# Patient Record
Sex: Male | Born: 1960 | Race: White | Hispanic: No | Marital: Single
Health system: Midwestern US, Academic
[De-identification: ages and names within clinical notes are randomized; demographics above are authoritative.]

---

## 2018-01-06 ENCOUNTER — Encounter: Admit: 2018-01-06 | Discharge: 2018-01-06

## 2018-01-06 MED ORDER — DULOXETINE 60 MG PO CPDR
60 mg | ORAL_CAPSULE | Freq: Two times a day (BID) | ORAL | 0 refills | 60.00000 days | Status: AC
Start: 2018-01-06 — End: 2019-11-17

## 2019-08-11 ENCOUNTER — Ambulatory Visit: Admit: 2019-08-11 | Discharge: 2019-08-11 | Payer: BC Managed Care – PPO

## 2019-08-11 ENCOUNTER — Encounter: Admit: 2019-08-11 | Discharge: 2019-08-11 | Payer: BC Managed Care – PPO

## 2019-08-11 DIAGNOSIS — Z1211 Encounter for screening for malignant neoplasm of colon: Secondary | ICD-10-CM

## 2019-08-11 DIAGNOSIS — K219 Gastro-esophageal reflux disease without esophagitis: Secondary | ICD-10-CM

## 2019-08-11 DIAGNOSIS — Z125 Encounter for screening for malignant neoplasm of prostate: Secondary | ICD-10-CM

## 2019-08-11 DIAGNOSIS — E139 Other specified diabetes mellitus without complications: Secondary | ICD-10-CM

## 2019-08-11 LAB — MICROALB/CR RATIO-URINE RANDOM: Lab: 38 mg/dL — ABNORMAL HIGH (ref 0.3–1.2)

## 2019-08-11 LAB — PSA SCREEN: Lab: 0.5 ng/mL (ref <4.01)

## 2019-08-11 LAB — CBC
Lab: 31 pg (ref 26–34)
Lab: 4.4 M/UL (ref 4.4–5.5)
Lab: 4.9 10*3/uL (ref 4.5–11.0)
Lab: 41 % (ref 40–50)

## 2019-08-11 LAB — COMPREHENSIVE METABOLIC PANEL
Lab: 138 MMOL/L (ref 137–147)
Lab: 4.5 MMOL/L (ref 3.5–5.1)

## 2019-08-11 LAB — LIPID PROFILE
Lab: 102 mg/dL (ref <200)
Lab: 19 mg/dL (ref 8.5–10.6)
Lab: 45 mg/dL (ref <100)
Lab: 60 mg/dL (ref 6.0–8.0)
Lab: 96 mg/dL (ref <150)

## 2019-08-11 LAB — TSH WITH FREE T4 REFLEX: Lab: 1.6 uU/mL (ref 0.35–5.00)

## 2019-08-11 LAB — HEMOGLOBIN A1C: Lab: 5.6 % (ref >40)

## 2019-08-12 ENCOUNTER — Encounter: Admit: 2019-08-12 | Discharge: 2019-08-12 | Payer: BC Managed Care – PPO

## 2019-08-12 DIAGNOSIS — E538 Deficiency of other specified B group vitamins: Secondary | ICD-10-CM

## 2019-08-13 ENCOUNTER — Ambulatory Visit: Admit: 2019-08-13 | Discharge: 2019-08-13 | Payer: BC Managed Care – PPO

## 2019-08-13 ENCOUNTER — Encounter: Admit: 2019-08-13 | Discharge: 2019-08-13 | Payer: BC Managed Care – PPO

## 2019-08-13 DIAGNOSIS — E538 Deficiency of other specified B group vitamins: Secondary | ICD-10-CM

## 2019-08-13 DIAGNOSIS — Z1211 Encounter for screening for malignant neoplasm of colon: Secondary | ICD-10-CM

## 2019-08-13 LAB — VITAMIN B12: Lab: 148 pg/mL — ABNORMAL LOW (ref 180–914)

## 2019-08-14 ENCOUNTER — Encounter: Admit: 2019-08-14 | Discharge: 2019-08-14 | Payer: BC Managed Care – PPO

## 2019-08-14 DIAGNOSIS — Z0000 Encounter for general adult medical examination without abnormal findings: Secondary | ICD-10-CM

## 2019-08-14 MED ORDER — CYANOCOBALAMIN (VITAMIN B-12) 1,000 MCG/ML IJ SOLN
1 mL | INTRAMUSCULAR | 3 refills | 29.00000 days | Status: AC
Start: 2019-08-14 — End: ?

## 2019-08-26 ENCOUNTER — Encounter: Admit: 2019-08-26 | Discharge: 2019-08-26 | Payer: BC Managed Care – PPO

## 2019-08-28 ENCOUNTER — Encounter: Admit: 2019-08-28 | Discharge: 2019-08-28 | Payer: BC Managed Care – PPO

## 2019-08-28 DIAGNOSIS — M1990 Unspecified osteoarthritis, unspecified site: Secondary | ICD-10-CM

## 2019-08-28 DIAGNOSIS — Z20822 Encounter for screening laboratory testing for COVID-19 virus in asymptomatic patient: Secondary | ICD-10-CM

## 2019-08-28 DIAGNOSIS — E119 Type 2 diabetes mellitus without complications: Secondary | ICD-10-CM

## 2019-08-28 DIAGNOSIS — I10 Essential (primary) hypertension: Secondary | ICD-10-CM

## 2019-08-28 MED ORDER — SUPREP BOWEL PREP KIT 17.5-3.13-1.6 GRAM PO SOLR
354 mL | Freq: Once | ORAL | 0 refills | Status: AC
Start: 2019-08-28 — End: ?

## 2019-09-05 ENCOUNTER — Encounter: Admit: 2019-09-05 | Discharge: 2019-09-06 | Payer: BC Managed Care – PPO

## 2019-09-08 ENCOUNTER — Encounter: Admit: 2019-09-08 | Discharge: 2019-09-08 | Payer: BC Managed Care – PPO

## 2019-09-08 DIAGNOSIS — M1990 Unspecified osteoarthritis, unspecified site: Secondary | ICD-10-CM

## 2019-09-08 DIAGNOSIS — I10 Essential (primary) hypertension: Secondary | ICD-10-CM

## 2019-09-08 DIAGNOSIS — E119 Type 2 diabetes mellitus without complications: Secondary | ICD-10-CM

## 2019-09-08 MED ORDER — LACTATED RINGERS IV SOLP
INTRAVENOUS | 0 refills | Status: DC
Start: 2019-09-08 — End: 2019-09-13

## 2019-09-08 MED ORDER — PROPOFOL INJ 10 MG/ML IV VIAL
0 refills | Status: DC
Start: 2019-09-08 — End: 2019-09-08

## 2019-09-08 MED ORDER — ONDANSETRON HCL (PF) 4 MG/2 ML IJ SOLN
4 mg | Freq: Once | INTRAVENOUS | 0 refills | Status: AC | PRN
Start: 2019-09-08 — End: ?

## 2019-09-08 MED ORDER — GLUCAGON HCL 1 MG/ML IJ SOLR
0 refills | Status: DC
Start: 2019-09-08 — End: 2019-09-08

## 2019-09-08 MED ORDER — LIDOCAINE (PF) 10 MG/ML (1 %) IJ SOLN
.1-2 mL | INTRAMUSCULAR | 0 refills | Status: DC | PRN
Start: 2019-09-08 — End: 2019-09-13

## 2019-09-08 MED ORDER — LACTATED RINGERS IV SOLP
0 refills | Status: DC
Start: 2019-09-08 — End: 2019-09-08

## 2019-09-08 MED ORDER — PROPOFOL 10 MG/ML IV EMUL 50 ML (INFUSION)(AM)(OR)
INTRAVENOUS | 0 refills | Status: DC
Start: 2019-09-08 — End: 2019-09-08

## 2019-09-08 MED ORDER — SIMETHICONE 40 MG/0.6 ML PO DRPS
0 refills | Status: DC
Start: 2019-09-08 — End: 2019-09-13

## 2019-09-09 ENCOUNTER — Encounter: Admit: 2019-09-09 | Discharge: 2019-09-09 | Payer: BC Managed Care – PPO

## 2019-09-09 DIAGNOSIS — E119 Type 2 diabetes mellitus without complications: Secondary | ICD-10-CM

## 2019-09-09 DIAGNOSIS — I10 Essential (primary) hypertension: Secondary | ICD-10-CM

## 2019-09-09 DIAGNOSIS — M1990 Unspecified osteoarthritis, unspecified site: Secondary | ICD-10-CM

## 2019-09-10 ENCOUNTER — Encounter: Admit: 2019-09-10 | Discharge: 2019-09-10 | Payer: BC Managed Care – PPO

## 2019-09-10 NOTE — Telephone Encounter
His liver enzymes are normal.  I don't think an abdominal ultrasound is necessary.  Since he has had gastric bypass and significant weight loss, this is the best thing for liver function, as well as no longer drinking ETOH.  He doesn't show any physical symptoms or lab abnormalities that are concerning for liver function and has already made changes that would be beneficial for overall liver health.

## 2019-09-10 NOTE — Telephone Encounter
Patient requesting Dr Stones advice re test results.   Philip Gilbert noted that his bilirubin is on the high end of normal.   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   He is wondering if an [name] of his liver would be advisable in light of his hx and the bili result.   Or any other advice?    Natasha Bence, RN

## 2019-09-14 ENCOUNTER — Encounter: Admit: 2019-09-14 | Discharge: 2019-09-14 | Payer: BC Managed Care – PPO

## 2019-09-15 MED ORDER — METFORMIN 500 MG PO TAB
ORAL_TABLET | Freq: Two times a day (BID) | ORAL | 5 refills | 90.00000 days | Status: AC
Start: 2019-09-15 — End: ?

## 2019-09-17 ENCOUNTER — Encounter: Admit: 2019-09-17 | Discharge: 2019-09-17 | Payer: BC Managed Care – PPO

## 2019-09-17 MED ORDER — PROMETHAZINE-CODEINE 6.25-10 MG/5 ML PO SYRP
5 mL | ORAL | 0 refills | 7.00000 days | Status: AC | PRN
Start: 2019-09-17 — End: ?

## 2019-09-17 NOTE — Telephone Encounter
Informed pt cough medicine script sent to his [facility] pharmacy

## 2019-09-17 NOTE — Telephone Encounter
Pt called re has had hacky cough for 2 days, unable to sleep.  States has had this before and cough med with codeine was the only thing that helped.  Has nasal congestion as well and has been using nasal spray

## 2019-10-14 MED ORDER — ATORVASTATIN 20 MG PO TAB
20 mg | ORAL_TABLET | Freq: Every day | ORAL | 3 refills | Status: AC
Start: 2019-10-14 — End: ?

## 2019-10-20 ENCOUNTER — Encounter: Admit: 2019-10-20 | Discharge: 2019-10-20 | Payer: BC Managed Care – PPO

## 2019-10-21 ENCOUNTER — Encounter: Admit: 2019-10-21 | Discharge: 2019-10-21 | Payer: BC Managed Care – PPO

## 2019-10-21 MED ORDER — BENZONATATE 100 MG PO CAP
100 mg | ORAL_CAPSULE | ORAL | 1 refills | 9.00000 days | Status: DC | PRN
Start: 2019-10-21 — End: 2019-12-22

## 2019-11-05 ENCOUNTER — Encounter: Admit: 2019-11-05 | Discharge: 2019-11-05 | Payer: BC Managed Care – PPO

## 2019-11-05 ENCOUNTER — Ambulatory Visit: Admit: 2019-11-05 | Discharge: 2019-11-05 | Payer: BC Managed Care – PPO

## 2019-11-05 DIAGNOSIS — M064 Inflammatory polyarthropathy: Secondary | ICD-10-CM

## 2019-11-05 DIAGNOSIS — E119 Type 2 diabetes mellitus without complications: Secondary | ICD-10-CM

## 2019-11-05 DIAGNOSIS — Z9889 Other specified postprocedural states: Secondary | ICD-10-CM

## 2019-11-05 DIAGNOSIS — K5904 Chronic idiopathic constipation: Secondary | ICD-10-CM

## 2019-11-05 DIAGNOSIS — M1990 Unspecified osteoarthritis, unspecified site: Secondary | ICD-10-CM

## 2019-11-05 DIAGNOSIS — I10 Essential (primary) hypertension: Secondary | ICD-10-CM

## 2019-11-05 DIAGNOSIS — R634 Abnormal weight loss: Secondary | ICD-10-CM

## 2019-11-05 DIAGNOSIS — Z79899 Other long term (current) drug therapy: Secondary | ICD-10-CM

## 2019-11-05 DIAGNOSIS — M069 Rheumatoid arthritis, unspecified: Secondary | ICD-10-CM

## 2019-11-05 DIAGNOSIS — R630 Anorexia: Secondary | ICD-10-CM

## 2019-11-05 DIAGNOSIS — D849 Immunodeficiency, unspecified: Secondary | ICD-10-CM

## 2019-11-05 DIAGNOSIS — Z0000 Encounter for general adult medical examination without abnormal findings: Secondary | ICD-10-CM

## 2019-11-05 DIAGNOSIS — E538 Deficiency of other specified B group vitamins: Secondary | ICD-10-CM

## 2019-11-05 DIAGNOSIS — Z9884 Bariatric surgery status: Secondary | ICD-10-CM

## 2019-11-05 LAB — CBC AND DIFF
Lab: 0 K/UL (ref 0–0.20)
Lab: 0.1 10*3/uL (ref 0–0.45)
Lab: 0.4 10*3/uL (ref 0–0.80)
Lab: 1 % (ref >60)
Lab: 1.7 10*3/uL (ref 1.0–4.8)
Lab: 10 % (ref 4–12)
Lab: 13 g/dL (ref 13.5–16.5)
Lab: 14 % (ref 11–15)
Lab: 2.3 K/UL (ref 1.8–7.0)
Lab: 269 10*3/uL (ref 150–400)
Lab: 31 pg (ref 26–34)
Lab: 32 g/dL (ref 32.0–36.0)
Lab: 36 % (ref 24–44)
Lab: 4 % (ref >60)
Lab: 4.3 M/UL — ABNORMAL LOW (ref 4.4–5.5)
Lab: 4.8 10*3/uL (ref 4.5–11.0)
Lab: 42 % (ref 40–50)
Lab: 49 % (ref 41–77)
Lab: 7.6 FL (ref 7–11)
Lab: 96 FL (ref 80–100)

## 2019-11-05 LAB — COMPREHENSIVE METABOLIC PANEL
Lab: 140 MMOL/L (ref 137–147)
Lab: 4.5 MMOL/L (ref 3.5–5.1)

## 2019-11-05 LAB — C REACTIVE PROTEIN (CRP): Lab: 0 mg/dL (ref <1.0)

## 2019-11-05 LAB — IRON + BINDING CAPACITY + %SAT+ FERRITIN
Lab: 353 ug/dL (ref 270–380)
Lab: 70 ug/dL (ref 50–185)
Lab: 74 ng/mL (ref 30–300)

## 2019-11-05 LAB — VITAMIN B12: Lab: 260 pg/mL — ABNORMAL LOW (ref 180–914)

## 2019-11-05 LAB — FOLATE, SERUM: Lab: >22 ng/mL (ref >3.9)

## 2019-11-05 MED ORDER — LINZESS 72 MCG PO CAP
72 ug | ORAL_CAPSULE | Freq: Every day | ORAL | 1 refills | 30.00000 days | Status: AC
Start: 2019-11-05 — End: ?

## 2019-11-05 NOTE — Progress Notes
Obtained patient's verbal consent to treat them and their agreement to [facility] financial policy and NPP via this telehealth visit during the [facility]    IMPRESSION:  59 year old male past medical history significant for chronic constipation, RA, multiple abdominal surgeries here for follow-up after colonoscopy.    Discussion: here is a 59 year old male with chronic constipation and multiple GI surgeries including gastric bypass here for follow-up after his colonoscopy.  He is still experiencing chronic constipation and reports having better control on Linzess.  He in the past was trialed with high-dose Linzess at 145 and reports effectiveness but this caused diarrhea.  Therefore, we will start him on low-dose of 72 mcg and he was advised to contact our clinic if diarrhea were to occur on this dose and to hold subsequent dosing.  We also discussed his low vitamin B12 levels.  Likely this is due to his previous gastric surgery.  He remains on IM injections.  We will go ahead and reevaluate levels and obtain other routine labs.  He was advised to avoid Imodium instead, he was advised to form a good bowel regimen to prevent overflow diarrhea.  We also discussed with the amounts of weight loss that he has been on with Ozempic that we have do not recommend any further weight loss.  We would like for him to discuss Ozempic long-term plans in light of this extreme weight loss with his primary care physician.  He may return to clinic in 6 months with Adam Case, APRN    1.  Chronic constipation  2.  Immunocompromised  3.  Rheumatoid arthritis  4.  Status post gastric bypass surgery and hernia repair x3  5.  Weight loss  6.  Low appetite  7.  Health maintenance  8.  Low vitamin B12 on IM injections  9.  Medication management        RECOMMENDATIONS:  The following labs will be repeated/ordered.  CBC, CMP, CRP, celiac panel, vitamin B12, folate, iron panel and vitamin D  Avoid Imodium  We can start Linzess 72 mcg daily    He will follow-up with his primary care regarding continued weight loss and to verify long-term plans with Ozempic in light of his extreme weight loss.    RTC in 6 months with Adam Case, APRN        Reason for Visit:  Follow-up, constipation    HPI:  Here is a 59 year old male with past medical history significant for chronic constipation, RA and multiple abdominal surgeries here for follow-up after colonoscopy.  Is a very pleasant gentleman who provides most of his medical records that we do have some history with an O2.  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  He reports inguinal hernia repair x3 and has had history of gastric bypass surgery.  He was initially referred as he had had a 70 pound weight loss likely due to Ozempic.  He underwent colonoscopy in March 2021 with our clinic and was found to have normal findings with the exception of redundant colon.  Of note, he does have history of rheumatoid arthritis and is on 20 mg methotrexate weekly with folic acid.  He reports he has had trouble with bowel prep in the and has questions regarding chronic constipation.  He mentions he has been trialed on Linzess in the past but can cause diarrhea.  He has tried MiraLAX in the past but reports better control with Linzess.    Today he mentions he is still  having periods of constipation and overflow diarrhea.  Typically, his constipation may last up to 3 days then he will have mushy urgent stools that may occur up to 2-3 times a day.  When this occurs, he also experiences tenesmus and lower abdominal cramping that precedes the episode.  Otherwise, he denies red flag symptoms of mucus, rectal pain, bloody stool or incontinence.  He denies symptoms concerning for obstruction such as nausea, vomiting, abdominal bloating or pain.  He does report a lowered appetite but again attributes this to Ozempic.  He does report joint pain related to rheumatoid arthritis for which is well controlled with 20 mg methotrexate.  He otherwise denies extraintestinal manifestations of his rash, eye pain/redness or oral ulcers.  Family history is negative for colorectal cancer, colorectal surgery or ulcerative colitis  He reports very rare use of NSAIDs but denies cigarette smoking.    History of the disease:  As above    PMH:  Medical History:   Diagnosis Date   ? Arthritis    ? DM (diabetes mellitus) (HCC)    ? Hypertension        PSH:  Surgical History:   Procedure Laterality Date   ? STOMACH SURGERY  1960    pyloric stenosis   ? HERNIA REPAIR  1961    1982, 1995   ? GASTRIC BYPASS  2003   ? LAP BAND  2012   ? CERVICAL SPINE SURGERY  2016   ? COLONOSCOPY DIAGNOSTIC WITH SPECIMEN COLLECTION BY BRUSHING/ WASHING - FLEXIBLE N/A 09/08/2019    Performed by Remigio Eisenmenger, MD at [facility] OR       Current Medications:    Current Outpatient Medications:   ?  ALPRAZolam (XANAX) 0.5 mg tablet, Take 1 tablet by mouth twice daily as needed., Disp: , Rfl:   ?  amLODIPine (NORVASC) 5 mg tablet, Take 5 mg by mouth daily., Disp: , Rfl:   ?  aspirin EC 81 mg tablet, Take 81 mg by mouth., Disp: , Rfl:   ?  atorvastatin (LIPITOR) 20 mg tablet, Take one tablet by mouth daily., Disp: 90 tablet, Rfl: 3  ?  benzonatate (TESSALON PERLES) 100 mg capsule, Take one capsule by mouth every 8 hours as needed for Cough., Disp: 30 capsule, Rfl: 1  ?  cyanocobalamin (RUBRAMIN) 1,000 mcg/mL injection, Inject 1 mL into the muscle every 30 days., Disp: 3 mL, Rfl: 3  ?  diclofenac sodium DR (VOLTAREN) 75 mg tablet, Take 75 mg by mouth., Disp: , Rfl:   ?  doxycycline (MONODOX) 100 mg capsule, Take 100 mg by mouth., Disp: , Rfl:   ?  duloxetine DR (CYMBALTA) 60 mg capsule, Take one capsule by mouth twice daily., Disp: 60 capsule, Rfl: 0  ?  ezetimibe (ZETIA) 10 mg tablet, Take 10 mg by mouth., Disp: , Rfl:   ?  folic acid (FOLVITE) 1 mg tablet, Take 1 mg by mouth daily., Disp: , Rfl:   ?  gabapentin (NEURONTIN) 600 mg tablet, Take 600 mg by mouth twice daily., Disp: , Rfl:   ?  hydrOXYchloroQUINE (PLAQUENIL) 200 mg tablet, Take 400 mg by mouth daily., Disp: , Rfl:   ?  metFORMIN (GLUCOPHAGE) 500 mg tablet, TAKE 2 TABLETS BY MOUTH TWICE DAILY, Disp: 120 tablet, Rfl: 5  ?  methotrexate sodium (RHEUMATREX) 2.5 mg tablet, TAKE 8 TABLETS BY MOUTH EVERY WEEK, Disp: , Rfl:   ?  Multivitamin,Tx-Minerals (MULTI-VITAMIN HP/MINERALS) cap, , Disp: , Rfl:   ?  omeprazole DR (PRILOSEC) 40 mg capsule, Take 40 mg by mouth daily., Disp: , Rfl:   ?  OZEMPIC 0.25 mg or 0.5 mg(2 mg/1.5 mL) injection PEN, , Disp: , Rfl:   ?  promethazine-codeine (PHENERGAN W/CODEINE) 6.25-10 mg/5 mL oral syrup, Take 5 mL by mouth every 6 hours as needed for Cough., Disp: 120 mL, Rfl: 0  ?  silodosin (RAPAFLO) 8 mg capsule, Take 8 mg by mouth daily., Disp: , Rfl:   ?  tamsulosin (FLOMAX) 0.4 mg capsule, Take 0.4 mg by mouth., Disp: , Rfl:   ?  traMADoL (ULTRAM) 50 mg tablet, Take 1 tablet by mouth at bedtime daily., Disp: , Rfl:     Allergies:  Allergies   Allergen Reactions   ? Ropinirole FLUSHING (SKIN), SEE COMMENTS, PHOTOSENSITIVITY and SWEATING       SHx:  Social History     Tobacco Use   ? Smoking status: Never Smoker   ? Smokeless tobacco: Never Used   Substance Use Topics   ? Alcohol use: Not Currently     Comment: quit in 2016   ? Drug use: Never       FHx:  Family History   Problem Relation Age of Onset   ? Cancer Mother         leukemia   ? COPD Father    ? Diabetes Father    ? Hypertension Father    ? Heart Disease Father    ? Diabetes Brother        Depression Screening:  Patient Scores:  PHQ-2: No data recorded  PHQ-9: No data recorded  Interventions:  PHQ-2: No data recorded  Depression Interventions PHQ-2/9: No data recorded    ROS:  Review of Systems    PE:  There were no vitals filed for this visit.        There is no height or weight on file to calculate BMI.    Physical Exam   Constitutional: Pt. is oriented to person, place, and time and in no distress.   HENT: Head: Normocephalic and atraumatic.   Eyes: Conjunctivae and EOM are normal. Pupils are equal, round, and reactive to light.   Neck: Normal range of motion. Neck supple.   Cardiovascular: Normal rate, regular rhythm and normal heart sounds.    Pulmonary/Chest: Effort normal and breath sounds normal. No respiratory distress. Pt. has no wheezes. Pt. has no rales.   Abdominal: Soft. Bowel sounds are normal. Pt. exhibits no distension and no mass. There is no tenderness. There is no rebound and no guarding.   Musculoskeletal: Normal range of motion. There is no edema, tenderness or deformity.   Neurological: Pt. is alert and oriented to person, place, and time. Gait normal.   Skin: Skin is warm and dry. No rash noted. No erythema.   Psychiatric: Affect and judgment normal.    08/2019  Colonoscopy    Findings:   ? ? ?The perianal and digital rectal examinations were normal.   ? ? ?The colon (entire examined portion) was moderately redundant.   ? ? ?Normal mucosa was found in the entire colon.   ? ? ?The retroflexed view of the distal rectum and anal verge was normal and   ? ? ?showed some prominant rectal veins but no anal abnormalities.   Impression: ? ? ? ? ? ? ? ? ? - Redundant colon.   ? ? ? ? ? ? ? ? ? ? ? ? ? ? ? - Normal mucosa  in the entire examined colon.   ? ? ? ? ? ? ? ? ? ? ? ? ? ? ? - No specimens collected.       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 High fiber diet , diet and exercise   Continue b12 replacement   Follow up PCP for his weight loss , reports 70 pound weight loss after starting ozempic   I personally performed the key portions of the E/M visit, discussed case with resident and concur with resident documentation of history, physical exam, assessment, and treatment plan with the following additions/modifications noted above.    Staff name:  Remigio Eisenmenger, MD Date:  11/05/2019

## 2019-11-05 NOTE — Patient Instructions
1. Stop Imodium  2. Linzess - daily. This was sent to your pharmacy on file.  3. Labs. Orders are in. You can go to any [place] lab location to complete.  4. Return to Gi clinic in 6 months to see Dr. Maryruth Bun.    As part of the CARES act, starting April 1st some results are released to you automatically. Your provider will continue to send you a detailed result note on any labs that they order, but with these changes you may see your results before they do. Critical lab results will be addressed immediately, but otherwise please give your provider 72 hours (3 business days) to view and respond to your results before reaching out with any questions. Depending on your questions, they may ask you to schedule a telehealth or telephone visit to discuss further. This visit may be billed to your insurance depending on time and complexity.      If you have any questions or concerns please contact Dr. Shelda Altes nurse, Dahlia Client, at  [phone]. If you have internet access/smart phone please contact us through   MyChart. This is the most efficient way to contact your healthcare team.     If you have any questions or concerns regarding your medications, please contact the IBD Pharmacists at [phone] or e-mail [email or web address].        [facility] Locations:     [address], [address] and [employer]  [address] 1st floor, directly to the left of the Information Desk  [place], [place] [address]  At [facility] and [address] adjacent to [facility] [place] [facility]  [phone]  ? 7 a.m.-6 p.m. Monday-Friday  ? 7 a.m.-noon Saturday  [facility] [place]  [facility]  [address] [place], [place] [address]  [phone]  ? 8 a.m.?4:30 p.m. Monday-Friday  [facility]  [address]  [place], [place] [address]  [phone]  ? 8 a.m.-5 p.m. Monday-Friday  [employer]  [facility] [place] [facility]  [address]  [place], [place] [address]  [phone]  ? 7 a.m.-5:30 p.m. Monday-Friday  [facility]  [address]  [place], [place] [address]  [phone]  ? 8 a.m.-4:30 p.m. Monday-Friday  Locations are closed all major holidays (excluding Veterans Day).

## 2019-11-09 ENCOUNTER — Encounter: Admit: 2019-11-09 | Discharge: 2019-11-09 | Payer: BC Managed Care – PPO

## 2019-11-09 MED ORDER — CHOLECALCIFEROL (VITAMIN D3) 1,250 MCG (50,000 UNIT) PO CAP
50000 [IU] | ORAL_CAPSULE | ORAL | 0 refills | 84.00000 days | Status: AC
Start: 2019-11-09 — End: ?

## 2019-11-09 NOTE — Telephone Encounter
-----   Message from Remigio Eisenmenger, MD sent at 11/09/2019  2:11 PM CDT -----  Dahlia Client .... please provide the vit D prescription.  Thx

## 2019-11-09 NOTE — Telephone Encounter
Vit b12 levels are still on the lower side , please continue your supplementation   Vit d levels are low , I recommend you take vit d 50,000IU once per week for 10 weeks followed by 2000IU once daily   Thx   Dr Maryruth Bun   Written by Remigio Eisenmenger, MD on 11/09/2019 ?2:11 PM CDT  Seen by patient Rolland Porter on 11/09/2019 ?2:11 PM CDT  --------------------------------------------------------------------------------------------------------------------------------------------------------------------    Vitamin D rx sent to pharmacy on file. Pt notified and provided instructions via MyChart.

## 2019-11-10 MED ORDER — OZEMPIC 0.25 MG OR 0.5 MG(2 MG/1.5 ML) SC PNIJ
.5 mg | SUBCUTANEOUS | 3 refills | Status: AC
Start: 2019-11-10 — End: ?

## 2019-11-15 ENCOUNTER — Encounter: Admit: 2019-11-15 | Discharge: 2019-11-15 | Payer: BC Managed Care – PPO

## 2019-11-17 MED ORDER — DULOXETINE 60 MG PO CPDR
60 mg | ORAL_CAPSULE | Freq: Two times a day (BID) | ORAL | 3 refills | 60.00000 days | Status: DC
Start: 2019-11-17 — End: 2019-12-24

## 2019-11-19 ENCOUNTER — Encounter: Admit: 2019-11-19 | Discharge: 2019-11-19 | Payer: BC Managed Care – PPO

## 2019-11-19 NOTE — Telephone Encounter
PA was received through [employer] and completed through [email or web address]  Key Code: [id number]  Medication: Linzess # 30 per 30 days  PA can take 24-72 hours for determination.

## 2019-11-24 ENCOUNTER — Encounter: Admit: 2019-11-24 | Discharge: 2019-11-24 | Payer: BC Managed Care – PPO

## 2019-11-26 ENCOUNTER — Encounter: Admit: 2019-11-26 | Discharge: 2019-11-26 | Payer: BC Managed Care – PPO

## 2019-12-03 ENCOUNTER — Encounter: Admit: 2019-12-03 | Discharge: 2019-12-03 | Payer: BC Managed Care – PPO

## 2019-12-03 NOTE — Telephone Encounter
Called patient to schedule appointment. Patient declines services at this time. Provided information about program and how to call and schedule in the future should patient change their mind.    Haynes Bast, MS RD LD  Care Management Dietitian   Voicemail Line [phone]

## 2019-12-11 ENCOUNTER — Encounter: Admit: 2019-12-11 | Discharge: 2019-12-11 | Payer: BC Managed Care – PPO

## 2019-12-11 MED ORDER — GABAPENTIN 600 MG PO TAB
ORAL_TABLET | Freq: Two times a day (BID) | 1 refills | Status: AC
Start: 2019-12-11 — End: ?

## 2019-12-11 NOTE — Telephone Encounter
LOV 08/11/19 Est Care

## 2019-12-12 ENCOUNTER — Encounter: Admit: 2019-12-12 | Discharge: 2019-12-12 | Payer: BC Managed Care – PPO

## 2019-12-14 ENCOUNTER — Encounter: Admit: 2019-12-14 | Discharge: 2019-12-14 | Payer: BC Managed Care – PPO

## 2019-12-15 ENCOUNTER — Encounter: Admit: 2019-12-15 | Discharge: 2019-12-15 | Payer: BC Managed Care – PPO

## 2019-12-16 ENCOUNTER — Ambulatory Visit: Admit: 2019-12-16 | Discharge: 2019-12-17 | Payer: BC Managed Care – PPO

## 2019-12-16 ENCOUNTER — Encounter: Admit: 2019-12-16 | Discharge: 2019-12-16 | Payer: BC Managed Care – PPO

## 2019-12-16 DIAGNOSIS — I10 Essential (primary) hypertension: Secondary | ICD-10-CM

## 2019-12-16 DIAGNOSIS — E785 Hyperlipidemia, unspecified: Secondary | ICD-10-CM

## 2019-12-16 DIAGNOSIS — M720 Palmar fascial fibromatosis [Dupuytren]: Secondary | ICD-10-CM

## 2019-12-16 DIAGNOSIS — M1990 Unspecified osteoarthritis, unspecified site: Secondary | ICD-10-CM

## 2019-12-16 DIAGNOSIS — Z9884 Bariatric surgery status: Secondary | ICD-10-CM

## 2019-12-16 DIAGNOSIS — E119 Type 2 diabetes mellitus without complications: Secondary | ICD-10-CM

## 2019-12-16 DIAGNOSIS — R630 Anorexia: Secondary | ICD-10-CM

## 2019-12-16 DIAGNOSIS — R5383 Other fatigue: Secondary | ICD-10-CM

## 2019-12-16 NOTE — Patient Instructions
It was nice to see you today!    Goals we discussed today:     Please lower your Ozempic to 0.25 mg-- this will help improve your appetite    Please come to the lab tomorrow morning at 8 AM fasting.     I would like to get you in with Dr. Margy Clarks, this might take a while. I will be reviewing your case with her once I have labs back.        Please contact [facility] for any questions or abnormal glucose values (above 300 or less than 70 repeatedly).  [phone]   My nurse, Melvenia Beam, can be reached at [phone]        Health goals for a person with diabetes to avoid complications are:    Check your FingerStick blood glucose:  Each time you take medications for your glucose, goals are:   Fasting and pre-meal glucose:  70-130 mg/dl   Bedtime blood glucose: 90-180 mg/dl    LDL Cholesterol:  <564 mg/dl   Triglycerides: <332 mg/dl   HDL >95 for men, >18 for women   Blood pressure:  Less than 130/90 mm HG  Maintain a Healthy Weight , Exercise: 30 minutes 5 times per week    Foot care:  Take good care of your feet.  Never cut your nails close to the tips of the toes. Promptly call your doctor if you have an infection, ulcer or cut anywhere on your feet. Wear shoes with a wide toe box and that fit comfortably.  Avoid walking barefoot.     Your recent lab values:    Glucose POC   Date/Time Value Ref Range Status   09/08/2019 09:42 AM 207 (A) 65 - 110 mg/dL Final     Cholesterol   Date/Time Value Ref Range Status   08/11/2019 10:29 AM 102 <200 MG/DL Final     HDL   Date/Time Value Ref Range Status   08/11/2019 10:29 AM 42 >40 MG/DL Final     LDL   Date/Time Value Ref Range Status   08/11/2019 10:29 AM 45 <100 mg/dL Final     Triglycerides   Date/Time Value Ref Range Status   08/11/2019 10:29 AM 96 <150 MG/DL Final

## 2019-12-16 NOTE — Progress Notes
Telehealth Visit Note    Date of Service: 12/16/2019    Subjective:      Obtained patient's verbal consent to treat them and their agreement to [facility] financial policy and NPP via this telehealth visit during the [facility] Emergency       Philip Gilbert is a 59 y.o. male.    History of Present Illness    The patient presents to the [facility] at [facility] for evaluation and management of diabetes mellitus via tele health: ZOOM. Pt was referred to [name] by Dr. Larina Bras.    A little over a year ago patient was started on Ozempic 0.5 mg.  He has lost around 60 pounds since starting.  He has felt very tired, listless, and had low libido.  He had lap band surgery in 2012.    He notes having a knot in his left hand.      Family history: Brother with Type 1 Diabetes    Other history: lap band in 2012, gerd, BPH, HTN, and rheumatoid arthritis    Type 2 Diabetes mellitus  Dx: 2000     Last A1c was 5.6% in February 2021    Current DM regimen: Metformin 1000 mg BID and Ozempic 0.5 mg every Sunday  Past treatment: Lantus  Adherence to medications: all the time  FSBG frequency: none  Hyperglycemia: no  Hypoglycemia: no  Hypoglycemia unawareness?: no  Meals per day / Carb intake: 2-3 very small meals/ day  Exercise: no    Complications of DM:  ? CAD: No  ? CVA: No  ? PVD: No  ? Amputations: No  ? Retinopathy: No  ? Gastropathy: No  ? Nephropathy: No  ? Neuropathy: No  ? Depression: No  ? Chronic wounds / delayed healing: No  ? DM related hospitalizations: No    Last dilated eye exam: 2020--needs referral  Last dental exam: upcoming with in July 2021  Last DM education / nutritionist visit:     DM related medications:  Statin: Yes, atorvastatin 20 mg  ACE-I: No  ASA: Yes      20 10 Endo Society Post-Bariatric Surgery Care Guidelines   Schedule for clinical and biochemical monitoring     Pre-op 1 mo 3 mo 6 mo 12 mo 18 mo 24 mo Annually   CBC x x x x x x x x   LFTs x x x x x x x x   Glucose x x x x x x x x Creat x x x x x x x x   Electrolytes x x x x x x x x   Iron/Ferritin x   X* X* X* X* X*   Vit B12 x   X* X* X* X* X*   Folate x   X* X* X* X* X*   Calcium x   X* X* X* X* X*   iPTH x   X* X* X* X* X*   25-D x   X* X* X* X* X*   Albumin/  Prealbumin x   X* X* X* X* X*   Vit A x      optional optional   Zinc x   optional optional  optional optional   Vit B1   optional optional optional optional optional optional   BMD x    X*  X* X*     X* = Examinations should only be performed after RYGB, BPD, or BPD/DS. All of them are considered as suggested for  patients submitted to restrictive  surgery where frank deficiencies are less common.                 Review of Systems  14 point review of systems was preformed and was positive for: Fatigue, poor appetite, unintentional weight loss GERD, and bump/knot on his left palm     Objective:         ? ALPRAZolam (XANAX) 0.5 mg tablet Take 1 tablet by mouth twice daily as needed.   ? amLODIPine (NORVASC) 5 mg tablet Take 5 mg by mouth daily.   ? aspirin EC 81 mg tablet Take 81 mg by mouth.   ? atorvastatin (LIPITOR) 20 mg tablet Take one tablet by mouth daily.   ? benzonatate (TESSALON PERLES) 100 mg capsule Take one capsule by mouth every 8 hours as needed for Cough.   ? cholecalciferol (vitamin D3) (OPTIMAL D3) 50,000 units capsule Take one capsule by mouth every 7 days. For a total of 10 weeks.   ? cyanocobalamin (RUBRAMIN) 1,000 mcg/mL injection Inject 1 mL into the muscle every 30 days.   ? diclofenac sodium DR (VOLTAREN) 75 mg tablet Take 75 mg by mouth.   ? doxycycline (MONODOX) 100 mg capsule Take 100 mg by mouth.   ? duloxetine DR (CYMBALTA) 60 mg capsule Take one capsule by mouth twice daily.   ? ezetimibe (ZETIA) 10 mg tablet Take 10 mg by mouth.   ? folic acid (FOLVITE) 1 mg tablet Take 1 mg by mouth daily.   ? gabapentin (NEURONTIN) 600 mg tablet TAKE 1 TABLET BY MOUTH TWICE DAILY   ? hydrOXYchloroQUINE (PLAQUENIL) 200 mg tablet Take 400 mg by mouth daily.   ? linaCLOtide (LINZESS) 72 mcg capsule Take one capsule by mouth daily.   ? metFORMIN (GLUCOPHAGE) 500 mg tablet TAKE 2 TABLETS BY MOUTH TWICE DAILY   ? methotrexate sodium (RHEUMATREX) 2.5 mg tablet TAKE 8 TABLETS BY MOUTH EVERY WEEK   ? Multivitamin,Tx-Minerals (MULTI-VITAMIN HP/MINERALS) cap    ? omeprazole DR (PRILOSEC) 40 mg capsule Take 40 mg by mouth daily.   ? OZEMPIC 0.25 mg or 0.5 mg(2 mg/1.5 mL) injection PEN Inject one-half mg under the skin every 7 days.   ? promethazine-codeine (PHENERGAN W/CODEINE) 6.25-10 mg/5 mL oral syrup Take 5 mL by mouth every 6 hours as needed for Cough.   ? silodosin (RAPAFLO) 8 mg capsule Take 8 mg by mouth daily.   ? tamsulosin (FLOMAX) 0.4 mg capsule Take 0.4 mg by mouth.   ? traMADoL (ULTRAM) 50 mg tablet Take 1 tablet by mouth at bedtime daily.     There were no vitals filed for this visit.  There is no height or weight on file to calculate BMI.     Telehealth Patient Reported Vitals     Row Name 12/16/19 1307                Weight:  84.8 kg (187 lb)        Height:  175.3 cm (69.02)        Pain Score:  Zero              Physical Exam  Constitutional:       Appearance: He is underweight.   Pulmonary:      Effort: Pulmonary effort is normal.   Musculoskeletal:      Cervical back: Normal range of motion.   Skin:     Comments: Bump on left hand   Neurological:  Mental Status: He is alert and oriented to person, place, and time.   Psychiatric:         Mood and Affect: Mood normal.         Behavior: Behavior normal.         Thought Content: Thought content normal.              Assessment and Plan:  Diabetes mellitus type 2,  controlled   ? Last A1c 5.6% in February, 2021   ? Target A1c <6.5% without frequent or severe hypoglycemia and > 70% time in range  ? Currently on Metformin 1000 mg twice daily and Ozempic 0.5 mg on Sundays  ? Complicated by: None    Plan:  ? Patient's last A1c demonstrates well-controlled blood sugars  ? I am most concerned about his unintentional weight loss  ? Complete annual bariatric lab work-up  ? Check GAD and C-peptide  ? Check total testosterone--reviewed this needs to be checked at 8 AM  ? Continue Metformin 1000 mg twice daily  ? Decrease Ozempic to 0.25 mg--to help stimulate appetite    Diabetic complication assessment:   ? Annual Dilated eye exam: Due, referral placed to ophthalmology  ? Annual labs (electrolytes and renal function): Reviewed, last done May, 2021  ? Annual Urine microalbumin/Cr: Due February, 2022  ? Foot exam / monofilament exam (recommended annually):     Supportive care for diabetes:  ? Diabetic Educator (recommended annually):   ? Nutritionist:     Knot in left hand  ? Pt with large knot in his left hand  ? Possibly Dupuytren's contracture  ? Referral placed to Ortho for assessment and treatment: Dr. Dickie La    Lipids: Hyperlipidemia    ? Recommendations for Statin treatment in diabetes:  ? 106-75 yo: No risk factors - moderate dose statin.  CVD risk factors or overt CVD - high dose statin.    ? CVD risk factors include none  ? Currently on a statin: Atorvastatin 20 mg  ? Annual fasting lipid panel due: February, 2022    Blood pressure: Hypertension   ? ACE-I or ARB: No      RTC 3 months with MD             Patient Instructions     It was nice to see you today!    Goals we discussed today:     Please lower your Ozempic to 0.25 mg-- this will help improve your appetite    Please come to the lab tomorrow morning at 8 AM fasting.     I would like to get you in with Dr. Margy Clarks, this might take a while. I will be reviewing your case with her once I have labs back.        Please contact [facility] for any questions or abnormal glucose values (above 300 or less than 70 repeatedly).  [phone]   My nurse, Melvenia Beam, can be reached at [phone]        Health goals for a person with diabetes to avoid complications are:    Check your FingerStick blood glucose:  Each time you take medications for your glucose, goals are:   Fasting and pre-meal glucose:  70-130 mg/dl   Bedtime blood glucose: 90-180 mg/dl    LDL Cholesterol:  <841 mg/dl   Triglycerides: <324 mg/dl   HDL >40 for men, >10 for women   Blood pressure:  Less than 130/90 mm HG  Maintain a Healthy  Weight , Exercise: 30 minutes 5 times per week    Foot care:  Take good care of your feet.  Never cut your nails close to the tips of the toes. Promptly call your doctor if you have an infection, ulcer or cut anywhere on your feet. Wear shoes with a wide toe box and that fit comfortably.  Avoid walking barefoot.     Your recent lab values:    Glucose POC   Date/Time Value Ref Range Status   09/08/2019 09:42 AM 207 (A) 65 - 110 mg/dL Final     Cholesterol   Date/Time Value Ref Range Status   08/11/2019 10:29 AM 102 <200 MG/DL Final     HDL   Date/Time Value Ref Range Status   08/11/2019 10:29 AM 42 >40 MG/DL Final     LDL   Date/Time Value Ref Range Status   08/11/2019 10:29 AM 45 <100 mg/dL Final     Triglycerides   Date/Time Value Ref Range Status   08/11/2019 10:29 AM 96 <150 MG/DL Final            Future Appointments   Date Time Provider Department Center   12/16/2019  1:30 PM Tatsumi, Dowty, APRN-NP [id number]   12/24/2019  1:30 PM Hiram Gash, MD [facility]   02/09/2020  8:00 AM Hiram Gash, MD [facility]   05/03/2020 10:30 AM Case, Rachelle Hora, APRN-NP [id number]                            60 minutes spent on this patient's encounter with counseling and coordination of care taking >50% of the visit.

## 2019-12-16 NOTE — Progress Notes
Obtained patient's verbal consent to treat them and their agreement to [place].  Patient hasn't had eye exam in the last year.

## 2019-12-17 ENCOUNTER — Encounter: Admit: 2019-12-17 | Discharge: 2019-12-17 | Payer: BC Managed Care – PPO

## 2019-12-17 ENCOUNTER — Ambulatory Visit: Admit: 2019-12-17 | Discharge: 2019-12-17 | Payer: BC Managed Care – PPO

## 2019-12-17 DIAGNOSIS — E139 Other specified diabetes mellitus without complications: Principal | ICD-10-CM

## 2019-12-17 DIAGNOSIS — Z9884 Bariatric surgery status: Secondary | ICD-10-CM

## 2019-12-17 DIAGNOSIS — R5383 Other fatigue: Secondary | ICD-10-CM

## 2019-12-17 LAB — PARATHYROID HORMONE: Lab: 99 pg/mL — ABNORMAL HIGH (ref 10–65)

## 2019-12-17 LAB — TESTOSTERONE,TOTAL: Lab: 446 ng/dL (ref 270–1070)

## 2019-12-17 LAB — HEMOGLOBIN A1C: Lab: 5.3 % (ref 4.0–6.0)

## 2019-12-17 LAB — PREALBUMIN: Lab: 27 mg/dL (ref 17–34)

## 2019-12-17 LAB — GLUCOSE, FASTING: Lab: 109 mg/dL — ABNORMAL HIGH (ref 70–100)

## 2019-12-17 LAB — FOLATE, SERUM: Lab: >22 ng/mL (ref >3.9)

## 2019-12-19 ENCOUNTER — Encounter: Admit: 2019-12-19 | Discharge: 2019-12-19 | Payer: BC Managed Care – PPO

## 2019-12-22 MED ORDER — BENZONATATE 100 MG PO CAP
ORAL_CAPSULE | Freq: Three times a day (TID) | ORAL | 1 refills | 9.00000 days | Status: AC | PRN
Start: 2019-12-22 — End: ?

## 2019-12-24 ENCOUNTER — Encounter: Admit: 2019-12-24 | Discharge: 2019-12-24 | Payer: BC Managed Care – PPO

## 2019-12-24 ENCOUNTER — Ambulatory Visit: Admit: 2019-12-24 | Discharge: 2019-12-25 | Payer: BC Managed Care – PPO

## 2019-12-24 DIAGNOSIS — I10 Essential (primary) hypertension: Secondary | ICD-10-CM

## 2019-12-24 DIAGNOSIS — M1990 Unspecified osteoarthritis, unspecified site: Secondary | ICD-10-CM

## 2019-12-24 DIAGNOSIS — F331 Major depressive disorder, recurrent, moderate: Secondary | ICD-10-CM

## 2019-12-24 DIAGNOSIS — E119 Type 2 diabetes mellitus without complications: Secondary | ICD-10-CM

## 2019-12-24 DIAGNOSIS — K219 Gastro-esophageal reflux disease without esophagitis: Secondary | ICD-10-CM

## 2019-12-24 DIAGNOSIS — R634 Abnormal weight loss: Secondary | ICD-10-CM

## 2019-12-24 DIAGNOSIS — E785 Hyperlipidemia, unspecified: Secondary | ICD-10-CM

## 2019-12-24 MED ORDER — BUPROPION XL 300 MG PO TB24
300 mg | ORAL_TABLET | Freq: Every morning | ORAL | 3 refills | Status: AC
Start: 2019-12-24 — End: ?

## 2019-12-24 NOTE — Progress Notes
Telehealth Visit Note    Date of Service: 12/24/2019      Subjective:      Obtained patient's verbal consent to treat them and their agreement to [name] financial policy and NPP via this telehealth visit during the [name]       Philip Gilbert is a 59 y.o. male.    Chief Complaint   Patient presents with   ? Follow Up     discuss blood work, weight loss      History of Present Illness  Patient made a telehealth appointment with multiple concerns.  Of most concern to him, he has had a significant weight loss unintentionally of about 80 pounds this year.  The last time I saw him was in February and he states that he continues to lose weight.  This is very concerning to him.  Since his last visit, he has had a colonoscopy that was normal as well as labs that were normal.  He has no specific symptoms other than feeling tired and weight loss.  He also has had a worsening in his depression.  Patient is receiving regular counseling and states he has had some increased stress recently.  Patient does not think that the Cymbalta is effective for his depression and he would like to try something different.  His brother has been on Wellbutrin and this has been effective for him so he would like to give this a try.  Patient also has diabetes that has been well controlled and he recently established with endocrine here at [name].  He has been on Ozempic for less than a year and it was thought that some of his weight loss might have been secondary to this.  Patient also has an ongoing cough that has been present for over 7 years.  He would like something to suppress his cough whenever he is speaking publicly.  Overall patient is concerned about his health and he plans to follow-up with [name] for further work-up for these symptoms.  He is mostly concerned about his weight loss.       Review of Systems   Constitutional: Positive for fatigue and unexpected weight change. Negative for fever.   Respiratory: Positive for cough. Negative for shortness of breath.    Cardiovascular: Negative for chest pain and leg swelling.   Gastrointestinal: Negative for abdominal pain and nausea.   Endocrine: Negative for cold intolerance and heat intolerance.   Psychiatric/Behavioral: Positive for sleep disturbance.         Objective:         ? ALPRAZolam (XANAX) 0.5 mg tablet Take 1 tablet by mouth twice daily as needed.   ? amLODIPine (NORVASC) 5 mg tablet Take 5 mg by mouth daily.   ? aspirin EC 81 mg tablet Take 81 mg by mouth.   ? atorvastatin (LIPITOR) 20 mg tablet Take one tablet by mouth daily.   ? benzonatate (TESSALON PERLES) 100 mg capsule TAKE 1 CAPSULE BY MOUTH EVERY 8 HOURS AS NEEDED FOR COUGH   ? buPROPion XL (WELLBUTRIN XL) 300 mg tablet Take one tablet by mouth every morning. Do not crush or chew.   ? cholecalciferol (vitamin D3) (OPTIMAL D3) 50,000 units capsule Take one capsule by mouth every 7 days. For a total of 10 weeks.   ? cyanocobalamin (RUBRAMIN) 1,000 mcg/mL injection Inject 1 mL into the muscle every 30 days.   ? diclofenac sodium DR (VOLTAREN) 75 mg tablet Take 75 mg by mouth.   ?  doxycycline (MONODOX) 100 mg capsule Take 100 mg by mouth.   ? eszopiclone (LUNESTA) 3 mg tablet Take 1 tablet by mouth nightly as needed.   ? ezetimibe (ZETIA) 10 mg tablet Take 10 mg by mouth.   ? folic acid (FOLVITE) 1 mg tablet Take 1 mg by mouth daily.   ? gabapentin (NEURONTIN) 600 mg tablet TAKE 1 TABLET BY MOUTH TWICE DAILY   ? hydrOXYchloroQUINE (PLAQUENIL) 200 mg tablet Take 400 mg by mouth daily.   ? linaCLOtide (LINZESS) 72 mcg capsule Take one capsule by mouth daily.   ? metFORMIN (GLUCOPHAGE) 500 mg tablet TAKE 2 TABLETS BY MOUTH TWICE DAILY   ? methotrexate sodium (RHEUMATREX) 2.5 mg tablet TAKE 8 TABLETS BY MOUTH EVERY WEEK   ? Multivitamin,Tx-Minerals (MULTI-VITAMIN HP/MINERALS) cap    ? omeprazole DR (PRILOSEC) 40 mg capsule Take 40 mg by mouth daily.   ? OZEMPIC 0.25 mg or 0.5 mg(2 mg/1.5 mL) injection PEN Inject one-half mg under the skin every 7 days.   ? promethazine-codeine (PHENERGAN W/CODEINE) 6.25-10 mg/5 mL oral syrup Take 5 mL by mouth every 6 hours as needed for Cough.   ? silodosin (RAPAFLO) 8 mg capsule Take 8 mg by mouth daily.   ? tamsulosin (FLOMAX) 0.4 mg capsule Take 0.4 mg by mouth.   ? traMADoL (ULTRAM) 50 mg tablet Take 1 tablet by mouth at bedtime daily.     Telehealth Patient Reported Vitals     Row Name 12/24/19 1317                Temp:  36.2 ?C (97.2 ?F)        Temp Source:  TEMPORAL        Weight:  84.8 kg (187 lb)        Height:  175.3 cm (69)        Pain Score:  Zero            There is no height or weight on file to calculate BMI.     Physical Exam  Gen: NAD, alert and oriented, patient looks considerably thinner since her last visit in February  Pulm: nonlabored breathing, able to speak in complete sentences without difficulty  Psych: normal affect         Assessment and Plan:  Philip Gilbert was seen today for follow up.    Diagnoses and all orders for this visit:    Rapid weight loss  -     CT ABD/PELV W CONTRAST; Future; Expected date: 12/24/2019  -     CT CHEST WO CONTRAST; Future; Expected date: 12/24/2019    Major depressive disorder, recurrent, moderate (HCC)    Gastroesophageal reflux disease without esophagitis    Other orders  -     buPROPion XL (WELLBUTRIN XL) 300 mg tablet; Take one tablet by mouth every morning. Do not crush or chew.    Reviewed most recent labs with patient as well as colonoscopy.  These were all normal.  I am concerned about patient's significant weight loss.  Will order CT chest abdomen pelvis for further evaluation for the cause of patient's weight loss.    Patient's depression has not been controlled and he has had increased stress.  Will stop Cymbalta and start Wellbutrin 300 mg daily.  Patient also is receiving regular counseling which I think is helpful.    Patient has a history of lap band and GERD.  His cough may be secondary to uncontrolled reflux.  He would  like a referral to ENT after his work-up with [name].  He will let me know and we can set up this referral.  Problem   Major Depressive Disorder, Recurrent, Moderate (Hcc)                       26 minutes spent on this patient's encounter with counseling and coordination of care taking >50% of the visit.

## 2019-12-24 NOTE — Patient Instructions
To schedule your radiology/imaging, please call [phone]

## 2019-12-28 ENCOUNTER — Encounter: Admit: 2019-12-28 | Discharge: 2019-12-28 | Payer: BC Managed Care – PPO

## 2019-12-28 MED ORDER — DICLOFENAC SODIUM 75 MG PO TBEC
75 mg | ORAL_TABLET | Freq: Two times a day (BID) | ORAL | 0 refills | 60.00000 days | Status: AC | PRN
Start: 2019-12-28 — End: ?

## 2019-12-28 NOTE — Telephone Encounter
LOV 12/24/19 tele health follow up med issues

## 2019-12-30 ENCOUNTER — Encounter: Admit: 2019-12-30 | Discharge: 2019-12-30 | Payer: BC Managed Care – PPO

## 2019-12-31 ENCOUNTER — Encounter: Admit: 2019-12-31 | Discharge: 2019-12-31 | Payer: BC Managed Care – PPO

## 2020-01-06 ENCOUNTER — Encounter: Admit: 2020-01-06 | Discharge: 2020-01-06 | Payer: BC Managed Care – PPO

## 2020-01-11 ENCOUNTER — Encounter: Admit: 2020-01-11 | Discharge: 2020-01-11 | Payer: BC Managed Care – PPO

## 2020-01-12 MED ORDER — OMEPRAZOLE 40 MG PO CPDR
40 mg | ORAL_CAPSULE | Freq: Every day | ORAL | 0 refills | Status: AC
Start: 2020-01-12 — End: ?

## 2020-02-02 ENCOUNTER — Encounter: Admit: 2020-02-02 | Discharge: 2020-02-02 | Payer: BC Managed Care – PPO

## 2020-02-08 ENCOUNTER — Encounter: Admit: 2020-02-08 | Discharge: 2020-02-08 | Payer: BC Managed Care – PPO

## 2020-02-08 MED ORDER — METFORMIN 500 MG PO TAB
ORAL_TABLET | Freq: Two times a day (BID) | 0 refills | Status: AC
Start: 2020-02-08 — End: ?

## 2020-02-09 ENCOUNTER — Encounter: Admit: 2020-02-09 | Discharge: 2020-02-09 | Payer: BC Managed Care – PPO

## 2020-02-21 ENCOUNTER — Encounter: Admit: 2020-02-21 | Discharge: 2020-02-21 | Payer: BC Managed Care – PPO

## 2020-02-21 MED ORDER — BENZONATATE 100 MG PO CAP
ORAL_CAPSULE | Freq: Three times a day (TID) | 1 refills | PRN
Start: 2020-02-21 — End: ?

## 2020-03-08 ENCOUNTER — Encounter: Admit: 2020-03-08 | Discharge: 2020-03-08 | Payer: BC Managed Care – PPO

## 2020-03-21 ENCOUNTER — Encounter: Admit: 2020-03-21 | Discharge: 2020-03-21 | Payer: BC Managed Care – PPO

## 2020-04-10 ENCOUNTER — Encounter: Admit: 2020-04-10 | Discharge: 2020-04-10 | Payer: BC Managed Care – PPO

## 2020-04-10 MED ORDER — OMEPRAZOLE 40 MG PO CPDR
ORAL_CAPSULE | Freq: Every day | 0 refills
Start: 2020-04-10 — End: ?

## 2020-04-26 ENCOUNTER — Encounter: Admit: 2020-04-26 | Discharge: 2020-04-26 | Payer: BC Managed Care – PPO

## 2020-05-08 ENCOUNTER — Encounter: Admit: 2020-05-08 | Discharge: 2020-05-08 | Payer: BC Managed Care – PPO

## 2020-05-08 MED ORDER — METFORMIN 500 MG PO TAB
ORAL_TABLET | Freq: Two times a day (BID) | 0 refills
Start: 2020-05-08 — End: ?

## 2020-06-13 ENCOUNTER — Encounter: Admit: 2020-06-13 | Discharge: 2020-06-13 | Payer: BC Managed Care – PPO

## 2020-06-13 MED ORDER — GABAPENTIN 600 MG PO TAB
ORAL_TABLET | Freq: Two times a day (BID) | 1 refills
Start: 2020-06-13 — End: ?

## 2020-07-15 ENCOUNTER — Encounter: Admit: 2020-07-15 | Discharge: 2020-07-15 | Payer: BC Managed Care – PPO

## 2020-07-15 MED ORDER — OMEPRAZOLE 40 MG PO CPDR
ORAL_CAPSULE | Freq: Every day | 0 refills
Start: 2020-07-15 — End: ?

## 2020-07-18 ENCOUNTER — Encounter: Admit: 2020-07-18 | Discharge: 2020-07-18 | Payer: BC Managed Care – PPO

## 2020-07-18 MED ORDER — OMEPRAZOLE 40 MG PO CPDR
ORAL_CAPSULE | Freq: Every day | 0 refills
Start: 2020-07-18 — End: ?

## 2020-08-13 ENCOUNTER — Encounter: Admit: 2020-08-13 | Discharge: 2020-08-13 | Payer: BC Managed Care – PPO

## 2020-08-13 MED ORDER — METFORMIN 500 MG PO TAB
ORAL_TABLET | Freq: Two times a day (BID) | 0 refills
Start: 2020-08-13 — End: ?

## 2020-08-15 ENCOUNTER — Encounter: Admit: 2020-08-15 | Discharge: 2020-08-15 | Payer: BC Managed Care – PPO

## 2020-08-15 MED ORDER — METFORMIN 500 MG PO TAB
ORAL_TABLET | Freq: Two times a day (BID) | 0 refills
Start: 2020-08-15 — End: ?

## 2020-08-28 ENCOUNTER — Encounter: Admit: 2020-08-28 | Discharge: 2020-08-28 | Payer: BC Managed Care – PPO

## 2020-08-28 MED ORDER — OMEPRAZOLE 40 MG PO CPDR
ORAL_CAPSULE | Freq: Every day | 0 refills
Start: 2020-08-28 — End: ?

## 2020-09-06 ENCOUNTER — Encounter: Admit: 2020-09-06 | Discharge: 2020-09-06 | Payer: BC Managed Care – PPO

## 2020-09-06 NOTE — Telephone Encounter
PC from pt  Pt verifying that records from [facility] dated 07/2020 have been received  Confirmed that they have been received and will be shared with Dr. Ellis Savage

## 2020-09-09 ENCOUNTER — Encounter: Admit: 2020-09-09 | Discharge: 2020-09-09 | Payer: BC Managed Care – PPO

## 2020-09-09 DIAGNOSIS — E538 Deficiency of other specified B group vitamins: Secondary | ICD-10-CM

## 2020-09-09 DIAGNOSIS — M069 Rheumatoid arthritis, unspecified: Secondary | ICD-10-CM

## 2020-09-09 DIAGNOSIS — I10 Essential (primary) hypertension: Secondary | ICD-10-CM

## 2020-09-09 DIAGNOSIS — E213 Hyperparathyroidism, unspecified: Secondary | ICD-10-CM

## 2020-09-09 DIAGNOSIS — E785 Hyperlipidemia, unspecified: Secondary | ICD-10-CM

## 2020-09-09 DIAGNOSIS — K219 Gastro-esophageal reflux disease without esophagitis: Secondary | ICD-10-CM

## 2020-09-09 DIAGNOSIS — M1990 Unspecified osteoarthritis, unspecified site: Secondary | ICD-10-CM

## 2020-09-09 DIAGNOSIS — E119 Type 2 diabetes mellitus without complications: Secondary | ICD-10-CM

## 2020-09-12 ENCOUNTER — Encounter: Admit: 2020-09-12 | Discharge: 2020-09-12 | Payer: BC Managed Care – PPO

## 2020-09-12 ENCOUNTER — Ambulatory Visit: Admit: 2020-09-12 | Discharge: 2020-09-13 | Payer: BC Managed Care – PPO

## 2020-09-12 DIAGNOSIS — K219 Gastro-esophageal reflux disease without esophagitis: Secondary | ICD-10-CM

## 2020-09-12 DIAGNOSIS — E785 Hyperlipidemia, unspecified: Secondary | ICD-10-CM

## 2020-09-12 DIAGNOSIS — E538 Deficiency of other specified B group vitamins: Secondary | ICD-10-CM

## 2020-09-12 DIAGNOSIS — E119 Type 2 diabetes mellitus without complications: Secondary | ICD-10-CM

## 2020-09-12 DIAGNOSIS — E213 Hyperparathyroidism, unspecified: Secondary | ICD-10-CM

## 2020-09-12 DIAGNOSIS — M069 Rheumatoid arthritis, unspecified: Secondary | ICD-10-CM

## 2020-09-12 DIAGNOSIS — I10 Essential (primary) hypertension: Secondary | ICD-10-CM

## 2020-09-12 DIAGNOSIS — M1990 Unspecified osteoarthritis, unspecified site: Secondary | ICD-10-CM

## 2020-09-13 ENCOUNTER — Encounter: Admit: 2020-09-13 | Discharge: 2020-09-13 | Payer: BC Managed Care – PPO

## 2020-09-13 DIAGNOSIS — N2581 Secondary hyperparathyroidism of renal origin: Principal | ICD-10-CM

## 2020-09-15 ENCOUNTER — Encounter: Admit: 2020-09-15 | Discharge: 2020-09-15 | Payer: BC Managed Care – PPO

## 2020-09-21 ENCOUNTER — Ambulatory Visit: Admit: 2020-09-21 | Discharge: 2020-09-21 | Payer: BC Managed Care – PPO

## 2020-09-21 ENCOUNTER — Encounter: Admit: 2020-09-21 | Discharge: 2020-09-21 | Payer: BC Managed Care – PPO

## 2020-09-21 DIAGNOSIS — I10 Essential (primary) hypertension: Secondary | ICD-10-CM

## 2020-09-21 DIAGNOSIS — E119 Type 2 diabetes mellitus without complications: Secondary | ICD-10-CM

## 2020-09-21 DIAGNOSIS — K219 Gastro-esophageal reflux disease without esophagitis: Secondary | ICD-10-CM

## 2020-09-21 DIAGNOSIS — M069 Rheumatoid arthritis, unspecified: Secondary | ICD-10-CM

## 2020-09-21 DIAGNOSIS — M720 Palmar fascial fibromatosis [Dupuytren]: Secondary | ICD-10-CM

## 2020-09-21 DIAGNOSIS — M1990 Unspecified osteoarthritis, unspecified site: Secondary | ICD-10-CM

## 2020-09-21 DIAGNOSIS — E538 Deficiency of other specified B group vitamins: Secondary | ICD-10-CM

## 2020-09-21 DIAGNOSIS — E785 Hyperlipidemia, unspecified: Secondary | ICD-10-CM

## 2020-09-21 DIAGNOSIS — E213 Hyperparathyroidism, unspecified: Secondary | ICD-10-CM

## 2020-09-21 NOTE — Progress Notes
SPINE CENTER HISTORY AND PHYSICAL    Chief Complaint:   Chief Complaint   Patient presents with   ? New Patient       Subjective     HISTORY OF PRESENT ILLNESS:   Philip Gilbert is a 60 y.o. male who  has a past medical history of Arthritis, DM (diabetes mellitus) (HCC), GERD (gastroesophageal reflux disease), Hyperlipemia, Hyperparathyroidism (HCC), Hypertension, Rheumatoid arthritis (HCC), Type II diabetes mellitus (HCC), and Vitamin B12 deficiency (04/28/2020). who presents for evaluation.  Patient is presenting with a longstanding history of left hand pain.  He was evaluated up at the [facility] who diagnosed him with Dupuytren's contracture.  Reports pain is constant nature, achy.  Pain is worse when he puts any pressure on the nodule that is formed on the third finger.  He is not interested in surgery at this point but is interested in interventional options.  He continues to try to maintain range of motion with stretches that he learned from specialist.  He has done other conservative management including ice and heat.             Lujean Amel denies any recent fevers, chills, infection, antibiotics, bowel or bladder incontinence, saddle anesthesia, bleeding issues, or recent anticoagulant.     ROS:   Review of Systems    Past Medical History:  Medical History:   Diagnosis Date   ? Arthritis    ? DM (diabetes mellitus) (HCC)    ? GERD (gastroesophageal reflux disease)    ? Hyperlipemia    ? Hyperparathyroidism (HCC)    ? Hypertension    ? Rheumatoid arthritis (HCC)    ? Type II diabetes mellitus (HCC)    ? Vitamin B12 deficiency 04/28/2020       Family History:  Family History   Problem Relation Age of Onset   ? Cancer Mother         leukemia   ? COPD Father    ? Diabetes Father    ? Hypertension Father    ? Heart Disease Father    ? Cancer-Lung Father    ? Diabetes Brother        Social History:  Lives in [place] [address]    Social History     Socioeconomic History   ? Marital status: Single Spouse name: Not on file   ? Number of children: 0   ? Years of education: Not on file   ? Highest education level: Not on file   Occupational History   ? 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 Smoking status: Never Smoker   ? Smokeless tobacco: Never Used   Substance and Sexual Activity   ? Alcohol use: Not Currently     Comment: quit in 2016   ? Drug use: Never   ? Sexual activity: Not on file   Other Topics Concern   ? Not on file   Social History Narrative   ? Not on file       Allergies:  Allergies   Allergen Reactions   ? Ropinirole FLUSHING (SKIN), PHOTOSENSITIVITY and SWEATING       Medications:    Current Outpatient Medications:   ?  ALPRAZolam (XANAX) 0.5 mg tablet, Take 1 tablet by mouth twice daily as needed., Disp: , Rfl:   ?  amLODIPine (NORVASC) 5 mg tablet, Take 5 mg by mouth daily., Disp: , Rfl:   ?  aspirin EC 81 mg tablet, Take 81 mg by mouth., Disp: , Rfl:   ?  benzonatate (TESSALON PERLES) 100 mg capsule, TAKE 1 CAPSULE BY MOUTH EVERY 8 HOURS AS NEEDED FOR COUGH, Disp: 30 capsule, Rfl: 0  ?  buPROPion XL (WELLBUTRIN XL) 300 mg tablet, Take one tablet by mouth every morning. Do not crush or chew., Disp: 90 tablet, Rfl: 3  ?  cyanocobalamin (RUBRAMIN) 1,000 mcg/mL injection, Inject 1 mL into the muscle every 30 days., Disp: 3 mL, Rfl: 3  ?  doxycycline (MONODOX) 100 mg capsule, Take 100 mg by mouth daily., Disp: , Rfl:   ?  eszopiclone (LUNESTA) 3 mg tablet, Take 1 tablet by mouth nightly as needed., Disp: , Rfl:   ?  ezetimibe (ZETIA) 10 mg tablet, Take 10 mg by mouth daily., Disp: , Rfl:   ?  finasteride (PROPECIA) 1 mg tablet, Take 1 mg by mouth daily., Disp: , Rfl:   ?  folic acid (FOLVITE) 1 mg tablet, Take 1 mg by mouth daily., Disp: , Rfl:   ?  gabapentin (NEURONTIN) 600 mg tablet, TAKE 1 TABLET BY MOUTH TWICE DAILY, Disp: 180 tablet, Rfl: 1  ?  hydrOXYchloroQUINE (PLAQUENIL) 200 mg tablet, Take 400 mg by mouth daily., Disp: , Rfl:   ? methotrexate sodium (RHEUMATREX) 2.5 mg tablet, TAKE 8 TABLETS BY MOUTH EVERY WEEK, Disp: , Rfl:   ?  Multivitamin,Tx-Minerals (MULTI-VITAMIN HP/MINERALS) cap, Take 1 capsule by mouth daily., Disp: , Rfl:   ?  omeprazole DR (PRILOSEC) 40 mg capsule, TAKE 1 CAPSULE BY MOUTH DAILY, Disp: 30 capsule, Rfl: 0  ?  promethazine-codeine (PHENERGAN W/CODEINE) 6.25-10 mg/5 mL oral syrup, Take 5 mL by mouth every 6 hours as needed for Cough., Disp: 120 mL, Rfl: 0  ?  semaglutide (OZEMPIC) 1 mg/dose (4 mg/3 mL) injection PEN, Inject 1 mg under the skin every 7 days., Disp: , Rfl:   ?  silodosin (RAPAFLO) 8 mg capsule, Take 8 mg by mouth daily., Disp: , Rfl:   ?  traMADoL (ULTRAM) 50 mg tablet, Take 1 tablet by mouth at bedtime daily., Disp: , Rfl:     Physical examination:   There were no vitals taken for this visit.       Gen: Alert & Oriented X 3  HEENT: EOMI  Neck: Supple, no elevated JVP  Heart: Extremities well perfused  Lungs: non labored breathing  Abdomen: Soft, non-tender, non-distended  Skin: no gross lesions appreciated  Ext: purposeful movement of extremities   Nodule noted left third finger.  Difficulty extending left fingers.    DIAGNOSTICS:  None to evaluate      Last Cr and LFT's:  Creatinine   Date Value Ref Range Status   11/05/2019 1.01 0.4 - 1.24 MG/DL Final     AST (SGOT)   Date Value Ref Range Status   11/05/2019 15 7 - 40 U/L Final     ALT (SGPT)   Date Value Ref Range Status   11/05/2019 15 7 - 56 U/L Final     Alk Phosphatase   Date Value Ref Range Status   11/05/2019 63 25 - 110 U/L Final     Total Bilirubin   Date Value Ref Range Status   11/05/2019 1.1 0.3 - 1.2 MG/DL Final            Assessment:  The pain complaints are most likely due to:  1. Dupuytren's contracture of left hand      Anselm Pancoast  Bargeron is a 60 y.o. male who  has a past medical history of Arthritis, DM (diabetes mellitus) (HCC), GERD (gastroesophageal reflux disease), Hyperlipemia, Hyperparathyroidism (HCC), Hypertension, Rheumatoid arthritis (HCC), Type II diabetes mellitus (HCC), and Vitamin B12 deficiency (04/28/2020). who presents for evaluation of pain.    Plan:  Scheduling a left third finger tendon sheath injection with steroid.  If he does not respond to this can consider trial of Xiaflex.  If he does not get any relief with any can consider surgical evaluation.    Risks/benefits of all pharmacologic and interventional treatments discussed and questions answered.     Thank you for this kind referral for consultation. Please feel free to contact me with any questions or concerns.

## 2020-09-28 ENCOUNTER — Encounter: Admit: 2020-09-28 | Discharge: 2020-09-28 | Payer: BC Managed Care – PPO

## 2020-10-12 ENCOUNTER — Encounter: Admit: 2020-10-12 | Discharge: 2020-10-12 | Payer: BC Managed Care – PPO

## 2020-10-12 ENCOUNTER — Ambulatory Visit: Admit: 2020-10-12 | Discharge: 2020-10-12 | Payer: BC Managed Care – PPO

## 2020-10-12 DIAGNOSIS — E213 Hyperparathyroidism, unspecified: Secondary | ICD-10-CM

## 2020-10-12 DIAGNOSIS — M069 Rheumatoid arthritis, unspecified: Secondary | ICD-10-CM

## 2020-10-12 DIAGNOSIS — E785 Hyperlipidemia, unspecified: Secondary | ICD-10-CM

## 2020-10-12 DIAGNOSIS — E538 Deficiency of other specified B group vitamins: Secondary | ICD-10-CM

## 2020-10-12 DIAGNOSIS — I10 Essential (primary) hypertension: Secondary | ICD-10-CM

## 2020-10-12 DIAGNOSIS — M1990 Unspecified osteoarthritis, unspecified site: Secondary | ICD-10-CM

## 2020-10-12 DIAGNOSIS — M720 Palmar fascial fibromatosis [Dupuytren]: Secondary | ICD-10-CM

## 2020-10-12 DIAGNOSIS — K219 Gastro-esophageal reflux disease without esophagitis: Secondary | ICD-10-CM

## 2020-10-12 DIAGNOSIS — E119 Type 2 diabetes mellitus without complications: Secondary | ICD-10-CM

## 2020-10-12 MED ORDER — TRIAMCINOLONE ACETONIDE 40 MG/ML IJ SUSP
10 mg | Freq: Once | INTRAMUSCULAR | 0 refills | Status: CP | PRN
Start: 2020-10-12 — End: ?
  Administered 2020-10-12: 14:00:00 10 mg via INTRAMUSCULAR

## 2020-10-12 MED ORDER — LIDOCAINE (PF) 10 MG/ML (1 %) IJ SOLN
1 mL | Freq: Once | INTRAMUSCULAR | 0 refills | Status: CP | PRN
Start: 2020-10-12 — End: ?
  Administered 2020-10-12: 14:00:00 1 mL via INTRAMUSCULAR

## 2020-10-12 NOTE — Procedures
Attending Surgeon: Lizbeth Bark, MD    Anesthesia: Local    Pre-Procedure Diagnosis:   1. Dupuytren's contracture of left hand        Post-Procedure Diagnosis:   1. Dupuytren's contracture of left hand        Pain Score: Zero    [place] [facility] SPINE TENDON INJECTION  Location: - sheath    Consent:   Consent obtained: verbal and written  Consent given by: patient  Risks discussed: Pain, Infection, Damage to surrounding structures and Bleeding  Alternatives discussed: alternative treatment  Discussed with patient the purpose of the treatment/procedure, other ways of treating my condition, including no treatment/ procedure and the risks and benefits of the alternatives. Patient has decided to proceed with treatment/procedure.        Universal Protocol:  Relevant documents: relevant documents present and verified  Test results: test results available and properly labeled  Imaging studies: imaging studies available  Required items: required blood products, implants, devices, and special equipment available  Site marked: the operative site was marked  Patient identity confirmed: Patient identify confirmed verbally with patient.        Time out: Immediately prior to procedure a time out was called to verify the correct patient, procedure, equipment, support staff and site/side marked as required      Procedures Details:             Indications: pain   Prep: alcohol  Approach: volar  Medications administered: 1 mL lidocaine PF 1% (10 mg/mL); 10 mg triamcinolone acetonide 40 mg/mL  Comments: After written verbal consent was obtained the patient was provided with a left 4th trigger finger injection.  The A1 pulley was palpated and marked.  The area was cleaned with chlorhexidine.  Utilizing a 27-gauge 1-1/4 inch needle the needle was inserted from distal to proximal at 45?Marland Kitchen  After inserting into the tendon sheath a solution containing 10 mg triamcinolone and 0.25 mL of 2 percent lidocaine was injected.  The needle was withdrawn and the area was cleaned.  Band-Aid was applied.  The patient was instructed to avoid soaking the area for at least 2-3 days.  Patient reported relief with injection.                     Estimated blood loss: none or minimal  Specimens: none  Patient tolerated the procedure well with no immediate complications. Pressure was applied, and hemostasis was accomplished.

## 2020-10-25 ENCOUNTER — Encounter: Admit: 2020-10-25 | Discharge: 2020-10-25 | Payer: BC Managed Care – PPO

## 2020-11-04 ENCOUNTER — Encounter: Admit: 2020-11-04 | Discharge: 2020-11-04 | Payer: BC Managed Care – PPO

## 2020-11-04 DIAGNOSIS — N2581 Secondary hyperparathyroidism of renal origin: Secondary | ICD-10-CM

## 2020-11-04 DIAGNOSIS — M8580 Other specified disorders of bone density and structure, unspecified site: Secondary | ICD-10-CM

## 2020-11-04 DIAGNOSIS — E119 Type 2 diabetes mellitus without complications: Secondary | ICD-10-CM

## 2020-11-04 DIAGNOSIS — Z9884 Bariatric surgery status: Secondary | ICD-10-CM

## 2020-11-04 DIAGNOSIS — E559 Vitamin D deficiency, unspecified: Secondary | ICD-10-CM

## 2020-11-04 LAB — COMPREHENSIVE METABOLIC PANEL
ALBUMIN: 4.4 g/dL (ref 3.5–5.0)
ALK PHOSPHATASE: 67 U/L (ref 25–110)
ALT: 64 U/L — ABNORMAL HIGH (ref 7–56)
ANION GAP: 11 (ref 3–12)
AST: 36 U/L (ref 7–40)
CALCIUM: 9.2 mg/dL (ref 8.5–10.6)
CO2: 29 MMOL/L (ref 21–30)
CREATININE: 1.1 mg/dL (ref 0.4–1.24)
EGFR: >60 mL/min (ref >60)
GLUCOSE,PANEL: 81 mg/dL (ref 70–100)
POTASSIUM: 4.6 MMOL/L (ref 3.5–5.1)
SODIUM: 142 MMOL/L (ref 137–147)

## 2020-11-04 LAB — PARATHYROID HORMONE: PTH HORMONE: 85 pg/mL — ABNORMAL HIGH (ref 10–65)

## 2020-11-04 LAB — PHOSPHORUS: PHOSPHORUS: 4.6 mg/dL — ABNORMAL HIGH (ref 2.0–4.5)

## 2020-11-05 ENCOUNTER — Encounter: Admit: 2020-11-05 | Discharge: 2020-11-05 | Payer: BC Managed Care – PPO

## 2020-11-08 ENCOUNTER — Encounter: Admit: 2020-11-08 | Discharge: 2020-11-08 | Payer: BC Managed Care – PPO

## 2020-11-08 DIAGNOSIS — R17 Unspecified jaundice: Secondary | ICD-10-CM

## 2020-11-09 ENCOUNTER — Encounter: Admit: 2020-11-09 | Discharge: 2020-11-09 | Payer: BC Managed Care – PPO

## 2020-11-09 DIAGNOSIS — R17 Unspecified jaundice: Secondary | ICD-10-CM

## 2020-11-09 LAB — PHOSPHORUS: PHOSPHORUS: 4 mg/dL (ref 2.0–4.5)

## 2020-11-09 LAB — COMPREHENSIVE METABOLIC PANEL
ALBUMIN: 4.2 g/dL (ref 3.5–5.0)
ALK PHOSPHATASE: 65 U/L (ref 25–110)
ALT: 79 U/L — ABNORMAL HIGH (ref 7–56)
ANION GAP: 10 (ref 3–12)
AST: 52 U/L — ABNORMAL HIGH (ref 7–40)
BLD UREA NITROGEN: 17 mg/dL (ref 7–25)
CALCIUM: 8.3 mg/dL — ABNORMAL LOW (ref 8.5–10.6)
CHLORIDE: 105 MMOL/L (ref 98–110)
CO2: 28 MMOL/L (ref 21–30)
CREATININE: 1 mg/dL (ref 0.4–1.24)
EGFR: >60 mL/min (ref >60)
GLUCOSE,PANEL: 77 mg/dL (ref 70–100)
POTASSIUM: 4.5 MMOL/L (ref 3.5–5.1)
SODIUM: 143 MMOL/L (ref 137–147)
TOTAL BILIRUBIN: 2 mg/dL — ABNORMAL HIGH (ref 0.3–1.2)
TOTAL PROTEIN: 6 g/dL (ref 6.0–8.0)

## 2020-11-09 LAB — BILIRUBIN, DIRECT: DIRECT BILIRUBIN: 0.4 mg/dL — ABNORMAL HIGH (ref <0.4)

## 2020-11-10 ENCOUNTER — Encounter: Admit: 2020-11-10 | Discharge: 2020-11-10 | Payer: BC Managed Care – PPO

## 2020-11-12 ENCOUNTER — Encounter: Admit: 2020-11-12 | Discharge: 2020-11-12 | Payer: BC Managed Care – PPO

## 2020-11-12 NOTE — Telephone Encounter
May we please mail these results to the patient's PCP?  Thank you     Results for Philip Gilbert, Philip Gilbert (MRN [id number]) as of 11/12/2020 10:59   Ref. Range 08/11/2019 10:29 11/05/2019 13:18 12/17/2019 08:26 12/17/2019 08:27 11/04/2020 13:23 11/09/2020 13:44   Sodium Latest Ref Range: 137 - 147 MMOL/L 138 140   142 143   Potassium Latest Ref Range: 3.5 - 5.1 MMOL/L 4.5 4.5   4.6 4.5   Chloride Latest Ref Range: 98 - 110 MMOL/L 101 102   102 105   CO2 Latest Ref Range: 21 - 30 MMOL/L 27 27   29 28    Anion Gap Latest Ref Range: 3 - 12  10 11   11 10    Blood Urea Nitrogen Latest Ref Range: 7 - 25 MG/DL 14 13   17 17    Creatinine Latest Ref Range: 0.4 - 1.24 MG/DL 4.54 0.98   1.19 1.47   eGFR Non African American Latest Ref Range: >60 mL/min >60 >60       eGFR African American Latest Ref Range: >60 mL/min >60 >60       eGFR Latest Ref Range: >60 mL/min     >60 >60   Glucose Latest Ref Range: 70 - 100 MG/DL 93 829 (H)   81 77   Albumin Latest Ref Range: 3.5 - 5.0 G/DL 4.0 4.4   4.4 4.2   Calcium Latest Ref Range: 8.5 - 10.6 MG/DL 9.0 8.9   9.2 8.3 (L)   Total Bilirubin Latest Ref Range: 0.3 - 1.2 MG/DL 1.2 1.1   2.0 (H) 2.0 (H)   Bilirubin, Direct Latest Ref Range: <0.4 MG/DL      0.4 (H)   Total Protein Latest Ref Range: 6.0 - 8.0 G/DL 6.3 6.2   6.4 6.0   Phosphorus Latest Ref Range: 2.0 - 4.5 MG/DL     4.6 (H) 4.0   Hemoglobin A1C Latest Ref Range: 4.0 - 6.0 % 5.6   5.3 5.3    Glucose Fasting Latest Ref Range: 70 - 100 MG/DL   562 (H)      Zinc Unknown    0.69     Prealbumin Latest Ref Range: 17 - 34 MG/DL    13.0     AST (SGOT) Latest Ref Range: 7 - 40 U/L 17 15   36 52 (H)   ALT (SGPT) Latest Ref Range: 7 - 56 U/L 17 15   64 (H) 79 (H)   Alk Phosphatase Latest Ref Range: 25 - 110 U/L 53 63   67 65   Glutamic Acid Decarboxylase AB Unknown    0.00     PTH Hormone Latest Ref Range: 10 - 65 PG/ML    99.0 (H) 85.1 (H)    TSH Latest Ref Range: 0.35 - 5.00 MCU/ML 1.68        C-Peptide Unknown    2.7     Testosterone,Total Latest Ref Range: 270 - 1,070 NG/DL    865     Cholesterol Latest Ref Range: <200 MG/DL 784        Triglycerides Latest Ref Range: <150 MG/DL 96        HDL Latest Ref Range: >40 MG/DL 42        LDL Latest Ref Range: <100 mg/dL 45        VLDL Latest Units: MG/DL 19        Non HDL Cholesterol Latest Units: MG/DL 60        Vitamin  A Unknown    60.0     Vitamin D(25-OH)Total Latest Ref Range: 30 - 80 NG/ML  17.6 (L)   39.2    PSA Screen Latest Ref Range: <4.01 NG/ML 0.52

## 2020-11-12 NOTE — Telephone Encounter
Will plan to call PCP on 5/31

## 2020-11-15 ENCOUNTER — Encounter: Admit: 2020-11-15 | Discharge: 2020-11-15 | Payer: BC Managed Care – PPO

## 2020-11-15 DIAGNOSIS — R7401 Elevated transaminase level: Secondary | ICD-10-CM

## 2020-11-28 ENCOUNTER — Encounter: Admit: 2020-11-28 | Discharge: 2020-11-28 | Payer: BC Managed Care – PPO

## 2020-12-07 ENCOUNTER — Encounter: Admit: 2020-12-07 | Discharge: 2020-12-07 | Payer: BC Managed Care – PPO

## 2020-12-08 ENCOUNTER — Encounter: Admit: 2020-12-08 | Discharge: 2020-12-08 | Payer: BC Managed Care – PPO

## 2020-12-08 ENCOUNTER — Ambulatory Visit: Admit: 2020-12-08 | Discharge: 2020-12-08 | Payer: BC Managed Care – PPO

## 2020-12-08 DIAGNOSIS — R7401 Elevated transaminase level: Secondary | ICD-10-CM

## 2020-12-08 LAB — COMPREHENSIVE METABOLIC PANEL
ALBUMIN: 4.2 g/dL (ref 3.5–5.0)
ALK PHOSPHATASE: 68 U/L (ref 25–110)
ALT: 52 U/L (ref 7–56)
AST: 35 U/L (ref 7–40)
BLD UREA NITROGEN: 18 mg/dL (ref 7–25)
CALCIUM: 8.8 mg/dL (ref 8.5–10.6)
CHLORIDE: 101 MMOL/L (ref 98–110)
CO2: 29 MMOL/L (ref 21–30)
CREATININE: 1.2 mg/dL — ABNORMAL HIGH (ref 0.4–1.24)
GLUCOSE,PANEL: 74 mg/dL (ref 70–100)
POTASSIUM: 4.7 MMOL/L (ref 3.5–5.1)
SODIUM: 140 MMOL/L (ref 137–147)
TOTAL BILIRUBIN: 1.7 mg/dL — ABNORMAL HIGH (ref 0.3–1.2)
TOTAL PROTEIN: 6 g/dL (ref 6.0–8.0)

## 2020-12-12 ENCOUNTER — Encounter: Admit: 2020-12-12 | Discharge: 2020-12-12 | Payer: BC Managed Care – PPO

## 2020-12-12 ENCOUNTER — Ambulatory Visit: Admit: 2020-12-12 | Discharge: 2020-12-12 | Payer: BC Managed Care – PPO

## 2020-12-13 ENCOUNTER — Encounter: Admit: 2020-12-13 | Discharge: 2020-12-13 | Payer: BC Managed Care – PPO

## 2020-12-13 DIAGNOSIS — R17 Unspecified jaundice: Secondary | ICD-10-CM

## 2020-12-13 DIAGNOSIS — R7989 Other specified abnormal findings of blood chemistry: Secondary | ICD-10-CM

## 2020-12-14 ENCOUNTER — Encounter: Admit: 2020-12-14 | Discharge: 2020-12-14 | Payer: BC Managed Care – PPO

## 2020-12-14 ENCOUNTER — Ambulatory Visit: Admit: 2020-12-14 | Discharge: 2020-12-15 | Payer: BC Managed Care – PPO

## 2020-12-15 ENCOUNTER — Encounter: Admit: 2020-12-15 | Discharge: 2020-12-15 | Payer: BC Managed Care – PPO

## 2021-01-06 ENCOUNTER — Ambulatory Visit: Admit: 2021-01-06 | Discharge: 2021-01-06 | Payer: BC Managed Care – PPO

## 2021-01-06 ENCOUNTER — Encounter: Admit: 2021-01-06 | Discharge: 2021-01-06 | Payer: BC Managed Care – PPO

## 2021-01-06 DIAGNOSIS — R17 Unspecified jaundice: Secondary | ICD-10-CM

## 2021-01-06 DIAGNOSIS — Z9884 Bariatric surgery status: Secondary | ICD-10-CM

## 2021-01-06 DIAGNOSIS — E559 Vitamin D deficiency, unspecified: Secondary | ICD-10-CM

## 2021-01-06 DIAGNOSIS — E213 Hyperparathyroidism, unspecified: Secondary | ICD-10-CM

## 2021-01-06 DIAGNOSIS — R7989 Other specified abnormal findings of blood chemistry: Secondary | ICD-10-CM

## 2021-01-06 LAB — COMPREHENSIVE METABOLIC PANEL
ALBUMIN: 4.1 g/dL (ref 3.5–5.0)
ANION GAP: 9 FL (ref 3–12)
CHLORIDE: 107 MMOL/L (ref 98–110)
CREATININE: 1.1 mg/dL (ref 0.4–1.24)
GLUCOSE,PANEL: 85 mg/dL (ref 70–100)
POTASSIUM: 4.9 MMOL/L (ref 3.5–5.1)
SODIUM: 143 MMOL/L (ref 137–147)
TOTAL PROTEIN: 5.9 g/dL — ABNORMAL LOW (ref 6.0–8.0)

## 2021-01-06 LAB — CBC
HEMATOCRIT: 39 % — ABNORMAL LOW (ref 40–50)
HEMOGLOBIN: 13 g/dL — ABNORMAL LOW (ref 13.5–16.5)
MCH: 31 pg (ref >60)
MCHC: 33 g/dL (ref 32.0–36.0)
MPV: 8.2 FL (ref 7–11)
PLATELET COUNT: 210 K/UL (ref 150–400)
RBC COUNT: 4.1 M/UL — ABNORMAL LOW (ref 4.4–5.5)
RDW: 13 % (ref 11–15)
WBC COUNT: 3.2 K/UL — ABNORMAL LOW (ref 4.5–11.0)

## 2021-01-06 LAB — FOLATE, SERUM: SERUM FOLATE: >22 ng/mL — ABNORMAL HIGH (ref >3.9)

## 2021-01-06 LAB — PARATHYROID HORMONE: PTH HORMONE: 166 pg/mL — ABNORMAL HIGH (ref 10–65)

## 2021-01-06 LAB — PHOSPHORUS: PHOSPHORUS: 3.3 mg/dL (ref 2.0–4.5)

## 2021-01-06 LAB — VITAMIN B12: VITAMIN B12: 152 pg/mL — ABNORMAL HIGH (ref 180–914)

## 2021-01-10 ENCOUNTER — Encounter: Admit: 2021-01-10 | Discharge: 2021-01-10 | Payer: BC Managed Care – PPO

## 2021-01-10 DIAGNOSIS — E213 Hyperparathyroidism, unspecified: Secondary | ICD-10-CM

## 2021-02-02 ENCOUNTER — Encounter: Admit: 2021-02-02 | Discharge: 2021-02-02 | Payer: BC Managed Care – PPO

## 2021-02-02 ENCOUNTER — Ambulatory Visit: Admit: 2021-02-02 | Discharge: 2021-02-02 | Payer: BC Managed Care – PPO

## 2021-02-02 DIAGNOSIS — E538 Deficiency of other specified B group vitamins: Secondary | ICD-10-CM

## 2021-02-02 DIAGNOSIS — E785 Hyperlipidemia, unspecified: Secondary | ICD-10-CM

## 2021-02-02 DIAGNOSIS — M069 Rheumatoid arthritis, unspecified: Secondary | ICD-10-CM

## 2021-02-02 DIAGNOSIS — M1990 Unspecified osteoarthritis, unspecified site: Secondary | ICD-10-CM

## 2021-02-02 DIAGNOSIS — E119 Type 2 diabetes mellitus without complications: Secondary | ICD-10-CM

## 2021-02-02 DIAGNOSIS — E213 Hyperparathyroidism, unspecified: Secondary | ICD-10-CM

## 2021-02-02 DIAGNOSIS — K219 Gastro-esophageal reflux disease without esophagitis: Secondary | ICD-10-CM

## 2021-02-02 DIAGNOSIS — I10 Essential (primary) hypertension: Secondary | ICD-10-CM

## 2021-02-02 DIAGNOSIS — N281 Cyst of kidney, acquired: Principal | ICD-10-CM

## 2021-02-02 LAB — CALCIUM-URINE RANDOM: UR. CALCIUM, RAN: 4.9 mg/dL

## 2021-02-02 LAB — CREATININE-URINE RANDOM: UR CREATININE, RAN: 103 mg/dL

## 2021-02-08 ENCOUNTER — Encounter: Admit: 2021-02-08 | Discharge: 2021-02-08 | Payer: BC Managed Care – PPO

## 2021-02-08 DIAGNOSIS — E538 Deficiency of other specified B group vitamins: Secondary | ICD-10-CM

## 2021-02-08 DIAGNOSIS — E119 Type 2 diabetes mellitus without complications: Secondary | ICD-10-CM

## 2021-02-08 DIAGNOSIS — K219 Gastro-esophageal reflux disease without esophagitis: Secondary | ICD-10-CM

## 2021-02-08 DIAGNOSIS — M069 Rheumatoid arthritis, unspecified: Secondary | ICD-10-CM

## 2021-02-08 DIAGNOSIS — E213 Hyperparathyroidism, unspecified: Secondary | ICD-10-CM

## 2021-02-08 DIAGNOSIS — I10 Essential (primary) hypertension: Secondary | ICD-10-CM

## 2021-02-08 DIAGNOSIS — E785 Hyperlipidemia, unspecified: Secondary | ICD-10-CM

## 2021-02-08 DIAGNOSIS — M1990 Unspecified osteoarthritis, unspecified site: Secondary | ICD-10-CM

## 2021-03-21 ENCOUNTER — Encounter: Admit: 2021-03-21 | Discharge: 2021-03-21 | Payer: BC Managed Care – PPO

## 2021-03-23 ENCOUNTER — Ambulatory Visit: Admit: 2021-03-23 | Discharge: 2021-03-23 | Payer: BC Managed Care – PPO

## 2021-03-23 ENCOUNTER — Encounter: Admit: 2021-03-23 | Discharge: 2021-03-23 | Payer: BC Managed Care – PPO

## 2021-03-23 DIAGNOSIS — E119 Type 2 diabetes mellitus without complications: Secondary | ICD-10-CM

## 2021-03-23 DIAGNOSIS — I10 Essential (primary) hypertension: Secondary | ICD-10-CM

## 2021-03-23 DIAGNOSIS — M1990 Unspecified osteoarthritis, unspecified site: Secondary | ICD-10-CM

## 2021-03-23 DIAGNOSIS — E538 Deficiency of other specified B group vitamins: Secondary | ICD-10-CM

## 2021-03-23 DIAGNOSIS — K219 Gastro-esophageal reflux disease without esophagitis: Secondary | ICD-10-CM

## 2021-03-23 DIAGNOSIS — E785 Hyperlipidemia, unspecified: Secondary | ICD-10-CM

## 2021-03-23 DIAGNOSIS — E213 Hyperparathyroidism, unspecified: Secondary | ICD-10-CM

## 2021-03-23 DIAGNOSIS — M069 Rheumatoid arthritis, unspecified: Secondary | ICD-10-CM

## 2021-03-29 ENCOUNTER — Encounter: Admit: 2021-03-29 | Discharge: 2021-03-29 | Payer: BC Managed Care – PPO

## 2021-03-30 ENCOUNTER — Encounter: Admit: 2021-03-30 | Discharge: 2021-03-30 | Payer: BC Managed Care – PPO

## 2021-05-22 ENCOUNTER — Encounter: Admit: 2021-05-22 | Discharge: 2021-05-22 | Payer: BC Managed Care – PPO

## 2021-06-15 ENCOUNTER — Encounter: Admit: 2021-06-15 | Discharge: 2021-06-15 | Payer: BC Managed Care – PPO

## 2021-06-19 ENCOUNTER — Encounter: Admit: 2021-06-19 | Discharge: 2021-06-19 | Payer: BC Managed Care – PPO

## 2021-06-19 DIAGNOSIS — M1990 Unspecified osteoarthritis, unspecified site: Secondary | ICD-10-CM

## 2021-06-19 DIAGNOSIS — E538 Deficiency of other specified B group vitamins: Secondary | ICD-10-CM

## 2021-06-19 DIAGNOSIS — E213 Hyperparathyroidism, unspecified: Secondary | ICD-10-CM

## 2021-06-19 DIAGNOSIS — M069 Rheumatoid arthritis, unspecified: Secondary | ICD-10-CM

## 2021-06-19 DIAGNOSIS — E785 Hyperlipidemia, unspecified: Secondary | ICD-10-CM

## 2021-06-19 DIAGNOSIS — I10 Essential (primary) hypertension: Secondary | ICD-10-CM

## 2021-06-19 DIAGNOSIS — K219 Gastro-esophageal reflux disease without esophagitis: Secondary | ICD-10-CM

## 2021-06-19 DIAGNOSIS — E119 Type 2 diabetes mellitus without complications: Secondary | ICD-10-CM

## 2021-06-21 ENCOUNTER — Encounter: Admit: 2021-06-21 | Discharge: 2021-06-21 | Payer: BC Managed Care – PPO

## 2021-06-21 ENCOUNTER — Ambulatory Visit: Admit: 2021-06-21 | Discharge: 2021-06-21 | Payer: BC Managed Care – PPO

## 2021-06-21 DIAGNOSIS — E538 Deficiency of other specified B group vitamins: Secondary | ICD-10-CM

## 2021-06-21 DIAGNOSIS — M069 Rheumatoid arthritis, unspecified: Secondary | ICD-10-CM

## 2021-06-21 DIAGNOSIS — K219 Gastro-esophageal reflux disease without esophagitis: Secondary | ICD-10-CM

## 2021-06-21 DIAGNOSIS — E119 Type 2 diabetes mellitus without complications: Secondary | ICD-10-CM

## 2021-06-21 DIAGNOSIS — M1990 Unspecified osteoarthritis, unspecified site: Secondary | ICD-10-CM

## 2021-06-21 DIAGNOSIS — E213 Hyperparathyroidism, unspecified: Secondary | ICD-10-CM

## 2021-06-21 DIAGNOSIS — I10 Essential (primary) hypertension: Secondary | ICD-10-CM

## 2021-06-21 DIAGNOSIS — E785 Hyperlipidemia, unspecified: Secondary | ICD-10-CM

## 2021-06-22 ENCOUNTER — Ambulatory Visit: Admit: 2021-06-22 | Discharge: 2021-06-22 | Payer: BC Managed Care – PPO

## 2021-06-22 ENCOUNTER — Encounter: Admit: 2021-06-22 | Discharge: 2021-06-22 | Payer: BC Managed Care – PPO

## 2021-06-22 DIAGNOSIS — E538 Deficiency of other specified B group vitamins: Secondary | ICD-10-CM

## 2021-06-22 DIAGNOSIS — N401 Enlarged prostate with lower urinary tract symptoms: Secondary | ICD-10-CM

## 2021-06-22 DIAGNOSIS — E119 Type 2 diabetes mellitus without complications: Secondary | ICD-10-CM

## 2021-06-22 DIAGNOSIS — I10 Essential (primary) hypertension: Secondary | ICD-10-CM

## 2021-06-22 DIAGNOSIS — K219 Gastro-esophageal reflux disease without esophagitis: Secondary | ICD-10-CM

## 2021-06-22 DIAGNOSIS — E213 Hyperparathyroidism, unspecified: Secondary | ICD-10-CM

## 2021-06-22 DIAGNOSIS — M1990 Unspecified osteoarthritis, unspecified site: Secondary | ICD-10-CM

## 2021-06-22 DIAGNOSIS — E785 Hyperlipidemia, unspecified: Secondary | ICD-10-CM

## 2021-06-22 DIAGNOSIS — M069 Rheumatoid arthritis, unspecified: Secondary | ICD-10-CM

## 2021-06-22 DIAGNOSIS — Z125 Encounter for screening for malignant neoplasm of prostate: Secondary | ICD-10-CM

## 2021-06-22 DIAGNOSIS — R399 Unspecified symptoms and signs involving the genitourinary system: Secondary | ICD-10-CM

## 2021-06-22 LAB — URINALYSIS DIPSTICK REFLEX TO CULTURE
GLUCOSE,UA: NEGATIVE
LEUKOCYTES: NEGATIVE
NITRITE: NEGATIVE
PROTEIN,UA: NEGATIVE
URINE ASCORBIC ACID, UA: NEGATIVE
URINE BILE: NEGATIVE
URINE BLOOD: NEGATIVE
URINE KETONE: NEGATIVE
URINE PH: 5 (ref 5.0–8.0)
URINE SPEC GRAVITY: 1 (ref 1.005–1.030)

## 2021-06-22 LAB — COMPREHENSIVE METABOLIC PANEL
ALBUMIN: 4 g/dL (ref 3.5–5.0)
ALK PHOSPHATASE: 54 U/L (ref 25–110)
ALT: 21 U/L (ref 7–56)
ANION GAP: 9 (ref 3–12)
AST: 22 U/L (ref 7–40)
BLD UREA NITROGEN: 17 mg/dL (ref 7–25)
CALCIUM: 8.8 mg/dL (ref 8.5–10.6)
CO2: 28 MMOL/L (ref 21–30)
CREATININE: 1 mg/dL (ref 0.4–1.24)
EGFR: >60 mL/min (ref >60)
GLUCOSE,PANEL: 92 mg/dL (ref 70–100)
POTASSIUM: 4.2 MMOL/L (ref 3.5–5.1)
SODIUM: 141 MMOL/L (ref 137–147)
TOTAL BILIRUBIN: 1.4 mg/dL — ABNORMAL HIGH (ref 0.3–1.2)
TOTAL PROTEIN: 6.1 g/dL (ref 6.0–8.0)

## 2021-06-22 LAB — PARATHYROID HORMONE: PTH HORMONE: 141 pg/mL — ABNORMAL HIGH (ref 10–65)

## 2021-06-22 LAB — PSA SCREEN: PSA SCREEN: 0.4 ng/mL (ref <4.01)

## 2021-06-22 NOTE — Patient Instructions
What Men Should Know About Prostate Cancer Screening    Prostate cancer is the most common non-skin-related cancer in men in the [place]. It is also the second leading cause of cancer death in men. One in six men will be diagnosed with prostate cancer. African-American men face a one-in-three chance of being diagnosed. Over 28,000 men die each year from prostate cancer, but early detection may save lives.     The [facility] is concerned that recent reports about PSA (prostate-specific antigen) testing may confuse patients about the value of this prostate cancer screening tool.   The PSA test is not perfect. However, when used correctly, this blood test gives important information. The PSA test can help diagnose, assess the risk of, and monitor prostate disease such as cancer.     The Foundation believes men should talk to their health care provider about whether to get screened or not. Talk with your provider about the benefits and risks of testing. You should also talk about factors that can increase your risk for prostate cancer, including:   - your family history of prostate cancer (Did your father, brother or other relative have prostate cancer?);   - if you are African-American;   - a high BMI (a measure of your body fat)   - your age; and   - your previous health history.     What is PSA?    PSA is a substance made by the prostate gland. The level of PSA in a man?s blood can be a marker of many different prostate diseases, not just prostate cancer. The PSA test became available in the early 1990s and has been used widely since.     A number of things can change PSA levels and should be kept in mind when reading the results.   High PSA levels can be caused by more than just prostate cancer. Other causes of higher PSA levels include:   - prostatitis (inflammation of the prostate) and other types of urinary tract infections (UTIs);   - benign prostatic hyperplasia (BPH - enlargement of the prostate);   - injury; or   - treatments such as prostate biopsies (tissue samples) or cystoscopy (a test to look inside the urethra and bladder).     Men choosing the PSA test should know their results could be influenced by some important factors, such as:   - Blood PSA levels tend to rise with age.   - Larger prostates make more PSA.   - Change in PSA levels over time (known as PSA velocity) can be markers of both cancer risk and how quickly a cancer may be growing.     A prostate biopsy (tissue sample) is the only way to know for sure if you have prostate cancer. The decision to go ahead with a prostate biopsy should be based mostly on PSA and findings on a digital rectal exam (physical exam of your prostate). Other factors to take into account include your family history of prostate cancer, your race, results of any prior biopsies and other major health issues you may have.   The choice to use PSA for early detection of prostate cancer is a personal choice. While PSA screening has been shown to have benefits, it also carries risks.     Possible benefits of having a PSA test:   - A normal PSA test may put your mind at ease.   - A PSA test may find prostate cancer early before it  has spread.   - Early treatment of prostate cancer may help some men slow the spread of the disease.   - Early treatment of prostate cancer may help some men live longer.     Possible risks of having a PSA test:   - The PSA test is not perfect. A normal PSA result may miss some prostate cancers (a ?false negative?).   - Sometimes the test results suggest something is wrong when it isn?t (a ?false positive?). This can cause unneeded stress and worry.   - A ?false positive? PSA result may lead to an unneeded prostate biopsy (tissue sample). To learn more about prostate cancer, visit [email or web address]. [email or web address] also has patient information on prostate cancer and other urologic health resources.   - A positive PSA test may find a prostate cancer that is slow-growing and never would have caused you problems.   - Treatment of prostate cancer can cause side effects. Short- or long-term problems that can occur are issues with getting erections (?ED?), leaking urine, or bowel function.     Before you decide to have a PSA test, talk with your doctor about the benefits and risks of testing.   Also talk about your individual risk of prostate cancer, including your personal and family health history.     To learn more about prostate cancer, visit [email or web address].     [email or web address] also has patient information on prostate cancer and other urologic health resources.     [email or web address]    =========================================================================    TO TEST OR NOT TO TEST FOR PROSTATE CANCER:   A SHARED DECISION BETWEEN A PATIENT AND A DOCTOR    This discussion tool is about the prostate-specific antigen test, or ?PSA.? The PSA test is a blood test to help detect prostate cancer and other prostate health issues. With recent changes in guidelines to doctors of how the PSA test should be used, patients may be confused about whether PSA testing is right for them.    This tool will give you facts to help you and your doctor make a decision about whether a PSA test is right for you. It is not meant to replace a visit to your doctor. This tool will take about 10 minutes to read.    WHERE IS THE PROSTATE FOUND?  The prostate is found only in men. It is about the size of a walnut and found in front of the rectum, behind the penis, and under the bladder.     WHAT DOES THE PROSTATE DO?  The prostate makes semen, the fluid that protects and feeds sperm.    PROSTATE CANCER  After skin cancer, prostate cancer is the most common cancer in men. Prostate cancer is the second leading cause of cancer-related deaths for men. The [employer] says that one in six men will get cancer of the prostate during their lifetime.1  African-American men and men with a family history of prostate cancer have a higher chance of getting prostate cancer.    Prostate cancer is different from many cancers because it often grows very slowly. In 2012, only about 28,000 Americans died from prostate cancer, compared to 241,740 men diagnosed with the disease. Many men with prostate cancer will never know they have it unless they get tested. In these cases, symptoms or problems are more likely to result from testing and treatment than from the cancer itself.     PSA  PSA (prostate-specific antigen) is a substance made by the prostate gland.  The level of PSA in a man?s blood can be a marker of many different prostate diseases, not just prostate cancer. High PSA levels can be caused by more than just prostate cancer. Other causes of higher PSA levels are:   - prostatitis (swelling of the prostate) and other types of urinary tract infections (UTIs);   - benign prostatic hyperplasia (BPH - enlargement of the prostate);   - injury; or   - treatments such as prostate biopsies (tissue samples) or cystoscopy (a test to look inside the urethra and bladder).   A prostate biopsy (tissue sample) is the only way to know for sure if you have prostate cancer.    SHOULD I GET A PSA TEST?  One use of the PSA test is to screen for prostate cancer. Screening means to look for cancer early, before you see any signs of illness. If prostate cancer has spread outside the prostate by the time it is found, treatment is less likely to cure the cancer. The PSA test may let doctors find the cancer early when treatment might work better. This may stop the cancer from harming your health. This is the major possible benefit of screening.     On the other hand, follow-up tests and treatment can have risks. A biopsy can sometimes cause bleeding or infection. Treatment for prostate cancer can harm your health by causing sexual, urinary and bowel problems.     Many prostate cancers are so slow growing they don?t need treatment. If the cancer does not need treatment, the side effects of treatment may be worse than having the cancer. On the other hand, if the cancer does do better with treatment, then the harms of treatment may be worthwhile.     The problem is that doctors cannot know for certain which cancers will spread and which will never cause health problems.     IF I GET A PSA TEST, WHAT HAPPENS NEXT?  There are four possible outcomes to a PSA test:  1.  Your PSA is normal and you DO NOT have prostate cancer (a true negative).  2.  Your PSA is higher than normal and you DO have prostate cancer (a true positive).  3.  Your PSA is higher than normal but you DO NOT have prostate cancer (a false positive).  4.  Your PSA is normal but you DO have prostate cancer (a false negative).     Below is [an example] showing the chance of a true negative, false negative, false positive or true positive test. Most high PSA results are false positives (about 70 percent). Also, there is a small chance you may have prostate cancer even with a normal PSA test (about a 1-2 percent risk).    For example ?  10 in 100 have Elevated PSA  3 of 10 have prostate cancer.  7 of 10 have no cancer.    90 in 100 have Normal PSA  1 in 90 have prostate cancer.  89 in 90 have no cancer.    WHAT HAPPENS IF MY PSA RESULT IS HIGH?  Don?t panic. If your doctor says that your PSA is high, this does not necessarily mean that you have prostate cancer. Most causes of a high PSA are not due to cancer. To know if your high test result is from cancer or some other cause, your doctor may repeat your PSA test or may suggest a prostate biopsy (tissue sample). A biopsy is the only way to know for  sure if you have prostate cancer. Your doctor may send you to a urologist (a doctor who specializes in prostate problems). During a prostate biopsy, your doctor removes tiny samples of prostate tissue to look at under a microscope. Often, twelve or more samples are taken. The doctor gets the samples by placing a thin needle through the wall of the rectum. The biopsy is often done in the doctor?s office and takes about half an hour. A prostate biopsy can sometimes cause bleeding or infection.     WHAT IS THE AVERAGE RISK OF PROSTATE CANCER?  One in six men will be diagnosed with prostate cancer in their lifetime. On average, the risk of dying from prostate cancer is about three out of 100 American men. African-American men are at increased risk. They face a one-in-three chance of being diagnosed. About five out of 100 African-American men die from prostate cancer. Men whose father, brother or son had prostate cancer are also at increased risk of getting prostate cancer.    DOES PSA TESTING LOWER PROSTATE CANCER DEATH?  Two large studies have looked at the effect of PSA testing on death from prostate cancer. Both studies showed that PSA testing increased the numbers of prostate cancers found. (About 40 more men were diagnosed per 1,000 tested.) Only one study showed a benefit from testing. In that study, out of every 1,000 men who were screened with PSA and followed for 11 years, there was one fewer prostate cancer death. So we know that testing finds more cases of prostate cancer. Some of these extra prostate cancers would cause harm without treatment. But some of the extra prostate cancers found would not have caused harm or death. Current research studies suggest that if PSA testing lowers the risk of dying from prostate cancer, it does so by a small amount. But testing finds more prostate cancer in men, and some of these men are bothered by treatment side effects.    WHAT ARE MY TREATMENT CHOICES IF I HAVE PROSTATE CANCER?  In deciding whether to get a PSA test, it may be helpful to think about what you might do if you found out you had prostate cancer. Three common treatment choices are:  1. Prostatectomy (surgery to remove the prostate)  2.  Radiation   a. External beam radiation   b. Brachytherapy (placing radioactive seeds in the prostate)  3.  Active surveillance (watch closely with tests, possibly treat in future)  4.  Watchful waiting (watch for signs of prostate cancer, possibly treat in future)    PROSTATECTOMY OR RADIATION?  Prostatectomy or radiation tries to remove all the cancer cells. Prostatectomy is surgery to remove the prostate. Radiation tries to kill the prostate cancer with high-dose X-rays. The X-rays can come from a source outside the body (external beam radiation) or from radioactive seeds placed in the prostate (brachytherapy). Some research has shown that surgery or radiation may make it less likely for men with prostate cancer to die from prostate cancer within 10 to 20 years.2 Other research has shown that surgery or radiation may not lower the chances that men with prostate cancer will die from prostate cancer.3  However, it is also known that these treatments can cause problems. Below are the chances of common problems from two treatment choices.    SIDE EFFECTS FROM TREATMENT  How many men out of 100 say they are having these problems4?    Surgery Radiation   Bothered by problems with sexual functiona 43 to  63 in 100 38 to 57 in 100   Bothered by problems with urinationb 15 to 42 in 100 13 to 52 in 100   Bothered by problems with bowel movementsc 5 to 11 in 100 10 to 17 in 100     a.  Problems with sexual function can include not being able to get an erection, not being able to have intercourse, or being unhappy with the erections you can get.  b.  Problems with urination can include having to wear pads because you leak urine, frequent dribbling of urine, or having no control over your bladder.  c.  Problems with bowel movements can include frequent bowel movements, sudden urges to have bowel movements, or pain or bleeding with bowel movements.    ACTIVE SURVEILLANCE OR WATCHFUL WAITING (NO IMMEDIATE TREATMENT)  Men with slower-growing tumors and older men may choose not to be treated right away. Remember, many prostate cancers, especially those with lower PSA values, won?t grow quickly enough to cause harm. Active surveillance is when your doctors watch you closely with regular PSA (and other) tests. If your cancer starts to grow quickly, you can choose a more active treatment in the future. Watchful waiting involves no active treatment or testing. If you start to show signs of prostate cancer, you could choose a more active treatment in the future. While these choices may seem like ?doing nothing,? some studies have shown that in the short term (10 years or so), men who choose no immediate treatment are no more likely to die from prostate cancer than men who choose surgery or radiation. These men may spare themselves treatment side effects. Remember that many prostate cancers grow slowly and may never cause harm. In these cases, active treatment may pose a greater risk to health than active surveillance or watchful waiting.    IN SUMMARY ?   There are possible benefits to having a PSA test.  1.  A normal PSA test may put your mind at ease.  2.  A PSA test may find prostate cancer early before it has spread.  3.  Early treatment of prostate cancer may help some men to avoid problems from cancer.  4.  Early treatment of prostate cancer may help some men live longer.    THERE ARE POSSIBLE RISKS OF HAVING A PSA TEST.  1.  A normal PSA test may miss some prostate cancers (a ?false negative?).  2.  Sometimes the test results suggest something is wrong when it isn?t (a ?false positive?). This can cause unneeded worry and stress.  3.  A ?false positive? PSA test may lead to an unneeded prostate biopsy (tissue sample).  4.  A high PSA test may find a prostate cancer that is slow-growing and never would have caused you problems.  5.  Treatment of prostate cancer may cause you harm. Problems with getting erections, leaking urine or bowel function can occur.    You have been given a lot of information in this discussion tool. You are encouraged to talk with your doctor, friends, family members and other men who have had to make the choice whether to get a PSA test. Make sure that the information you get is correct.    After finding out the risks and benefits of getting a PSA test, many men decide to have the PSA test; others decide not to have the test. The final choice is up to you. Your choice should be based on your own values and your   views on the benefits and risks of PSA testing.    This tool was developed based on a webpage written by Grover Canavan, MD, MPH, at the Department of Family Medicine, [facility]. Used with permission from the author.    1.  SEER Stat Fact Sheets: Prostate. [employer], Surveillance Epidemiology and End Results. Available at: http://seer.kupitorta.com.    2.  Cherlyn Roberts, Sparkill, Hudes G, et al. Survival associated with treatment vs. observation of localized prostate cancer in elderly men. JAMA. May 30 2005;296(22):2683-2693.    3.  Wilt TJ, Brawer MK, Cloretta Ned, et al. Radical prostatectomy versus observation for localized prostate cancer. N Engl J Med. January 04 2011; 367:203-213.    4.  Potosky AL, [facility], Mikey Bussing RM, et al. Five-year outcomes after prostatectomy or radiotherapy for prostate cancer: the prostate cancer outcomes study. J Natl Cancer Inst. Mar 03 2003;96(18):1358-1367.    [email or web address]://www.edwards.[email or web address]    =======================================================================

## 2021-06-22 NOTE — Telephone Encounter
Called patient's PCP   Left a voicemail stating that I was patient's endocrinologist and saw patient yesterday   At our visit patient reported episodes of dizziness and syncope, I recommended that he reach out to his PCP and let him know I would as well.   Asked his PCP office if they could contact patient to make an appt to discuss and further evaluate.     Provided my call back information and asked again if patient could be seen as soon as possible to address this.

## 2021-06-22 NOTE — Assessment & Plan Note
Continue Silodosin (Rapaflo) 8 mg, as prescribed by PCP.

## 2021-06-22 NOTE — Assessment & Plan Note
DRE today --> benign.  PSA today --> pending.  -- will contact pt w/ result.  If PSA --> WNL, then f/u w/ PCP for routine prostate cancer screening, as clinically indicated.  F/u w/ [facility] U[facility] prn, if elevated PSA or other urologic concerns.

## 2021-06-26 ENCOUNTER — Encounter: Admit: 2021-06-26 | Discharge: 2021-06-26 | Payer: BC Managed Care – PPO

## 2021-06-26 NOTE — Telephone Encounter
06-26-2021 Per Task Message, request faxed to Dr Wray Kearns, (F) [phone], (P) [phone], requested records go to [employer], clp

## 2021-06-30 ENCOUNTER — Encounter: Admit: 2021-06-30 | Discharge: 2021-06-30 | Payer: BC Managed Care – PPO

## 2021-06-30 NOTE — Progress Notes
Patient contacted asked about prior cardiology visits.  Patient states he has not seen a cardiologist.

## 2021-07-03 ENCOUNTER — Encounter: Admit: 2021-07-03 | Discharge: 2021-07-03 | Payer: BC Managed Care – PPO

## 2021-07-03 DIAGNOSIS — M8580 Other specified disorders of bone density and structure, unspecified site: Secondary | ICD-10-CM

## 2021-07-03 DIAGNOSIS — E213 Hyperparathyroidism, unspecified: Secondary | ICD-10-CM

## 2021-07-05 ENCOUNTER — Encounter: Admit: 2021-07-05 | Discharge: 2021-07-05 | Payer: BC Managed Care – PPO

## 2021-07-05 ENCOUNTER — Ambulatory Visit: Admit: 2021-07-05 | Discharge: 2021-07-05 | Payer: BC Managed Care – PPO

## 2021-07-05 VITALS — BP 102/76 | HR 75 | Ht 69.0 in | Wt 177.9 lb

## 2021-07-05 VITALS — BP 109/77 | HR 67

## 2021-07-05 VITALS — BP 110/78 | HR 80

## 2021-07-05 VITALS — BP 103/70 | HR 86

## 2021-07-05 VITALS — BP 102/74

## 2021-07-05 DIAGNOSIS — E213 Hyperparathyroidism, unspecified: Secondary | ICD-10-CM

## 2021-07-05 DIAGNOSIS — I10 Essential (primary) hypertension: Secondary | ICD-10-CM

## 2021-07-05 DIAGNOSIS — M1990 Unspecified osteoarthritis, unspecified site: Secondary | ICD-10-CM

## 2021-07-05 DIAGNOSIS — E785 Hyperlipidemia, unspecified: Secondary | ICD-10-CM

## 2021-07-05 DIAGNOSIS — E538 Deficiency of other specified B group vitamins: Secondary | ICD-10-CM

## 2021-07-05 DIAGNOSIS — M069 Rheumatoid arthritis, unspecified: Secondary | ICD-10-CM

## 2021-07-05 DIAGNOSIS — K219 Gastro-esophageal reflux disease without esophagitis: Secondary | ICD-10-CM

## 2021-07-05 DIAGNOSIS — R55 Syncope and collapse: Principal | ICD-10-CM

## 2021-07-05 DIAGNOSIS — E139 Other specified diabetes mellitus without complications: Secondary | ICD-10-CM

## 2021-07-05 DIAGNOSIS — E119 Type 2 diabetes mellitus without complications: Secondary | ICD-10-CM

## 2021-07-05 NOTE — Progress Notes
Date of Service: 07/05/2021    CHIEF COMPLAINT: Syncope.     Referred by Wray Kearns        HISTORY OF PRESENT ILLNESS:  Philip Gilbert is a 61 y.o. male.      Patient is here today for further evaluation of few episodes of syncope.  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  He states that he had some funny sensation in his stomach and felt nauseous and there felt like he was going to pass out.  Therefore he walked to an adjacent room and sat down in his chair and thinks that he might have briefly passed out.  He reports feeling well immediately afterwards and went back to the mass.  About 3 weeks ago, while he was at church again and was leading a mass, he noticed that he was having similar symptoms of nausea and then felt lightheaded and felt like he might blackout.  He then went to the adjacent room as well and was drinking some water when he did completely blackout and the next thing he remembers was that he was on the floor.  He states that he must of been out for just a few seconds.  He reports no post episode confusion and reports that nobody told him he had any seizure-like activity.  He was able to sit up and felt well afterwards and went back and completed the ceremony.  He has not had any further episodes since.  He had no symptoms of palpitations during any of these episodes.  Otherwise, he has been fairly active.  He has lost about 100 pounds within the last year and a half and in the summer last year went to [place] and walked quite a bit and reports that he did well with this without any symptoms of exertional chest pain or dyspnea.  With his daily activities with climbing stairs and carrying grocery bags etc., he has not noticed any exertional symptoms of chest pain or dyspnea.  He reports no orthopnea or PND.    PMH (CARDIAC AND RELATED):    Hypertension  Hypercholesterolemia  Type 2 diabetes.  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  Secondary hyperparathyroidism.  Rheumatoid arthritis       EKG   07/05/2021.  Ordered and reviewed.  Normal sinus rhythm.  No pathologic Q-waves.  No evidence of LVH.  No significant ST-T wave changes.         Family History: No history of premature CAD    Social History:   Tobacco use: None  Alcohol use: Quit drinking 7 years ago.  History of heavy drinking in the past.  Recreational drug use: None    CURRENT MEDICATIONS:(including today's revisions)    ? ALPRAZolam (XANAX) 0.5 mg tablet Take 1 tablet by mouth twice daily as needed.   ? amLODIPine (NORVASC) 5 mg tablet Take 5 mg by mouth daily.   ? aspirin EC 81 mg tablet Take 81 mg by mouth daily.   ? buPROPion XL (WELLBUTRIN XL) 300 mg tablet Take one tablet by mouth every morning. Do not crush or chew.   ? celecoxib (CELEBREX) 200 mg capsule Take 200 mg by mouth daily.   ? cyanocobalamin 1,000 mcg tablet Take 1,000 mcg by mouth twice daily.   ? doxycycline (MONODOX) 100 mg capsule Take 100 mg by mouth daily.   ?  ergocalciferol (vitamin D2) (VITAMIN D PO) Take 2,000 Units by mouth daily.   ? eszopiclone (LUNESTA) 3 mg tablet Take 1 tablet by mouth nightly as needed.   ? ezetimibe (ZETIA) 10 mg tablet Take 10 mg by mouth daily.   ? finasteride (PROPECIA) 1 mg tablet Take 1 mg by mouth daily.   ? folic acid (FOLVITE) 1 mg tablet Take 1 mg by mouth daily.   ? gabapentin (NEURONTIN) 600 mg tablet TAKE 1 TABLET BY MOUTH TWICE DAILY   ? hydrOXYchloroQUINE (PLAQUENIL) 200 mg tablet Take 400 mg by mouth daily.   ? metFORMIN (GLUCOPHAGE) 500 mg tablet Take 1,000 mg by mouth twice daily after meals.   ? methotrexate sodium (RHEUMATREX) 2.5 mg tablet TAKE 8 TABLETS BY MOUTH EVERY WEEK   ? omeprazole DR (PRILOSEC) 40 mg capsule TAKE 1 CAPSULE BY MOUTH DAILY   ? promethazine-codeine (PHENERGAN W/CODEINE) 6.25-10 mg/5 mL oral syrup Take 5 mL by mouth every 6 hours as needed for Cough.   ? rosuvastatin (CRESTOR) 20 mg tablet Take 20 mg by mouth daily.   ? semaglutide (OZEMPIC) 1 mg/dose (4 mg/3 mL) injection PEN Inject 1 mg under the skin every 7 days.   ? silodosin (RAPAFLO) 8 mg capsule Take 8 mg by mouth daily.   ? traMADoL (ULTRAM) 50 mg tablet Take 1 tablet by mouth at bedtime daily.       Vitals:    07/05/21 1138 07/05/21 1144 07/05/21 1145 07/05/21 1146   BP: 102/74 109/77 110/78 103/70   BP Source: Arm, Right Upper Arm, Right Upper Arm, Right Upper Arm, Right Upper   Pulse:  67 80 86   SpO2:  99% 97% 98%   PainSc:       Weight:       Height:           BP Readings from Last 5 Encounters:   07/05/21 103/70   06/21/21 112/78   03/23/21 (P) 104/76   02/02/21 106/66   10/12/20 104/68     Body mass index is 26.27 kg/m?Marland Kitchen    PHYSICAL EXAMINATION:    GENERAL: Patient is resting comfortably, in no apparent distress, alert and oriented.   NECK: No jugular venous distention.    CHEST: Chest is clear to auscultation bilaterally with no wheezes, crackles or rhonchi.  CARDIOVASCULAR: S1 and S2 are normal and regular.  No significant murmur auscultated.   EXTREMITIES: No edema bilaterally.        LABS:  Available and relevant labs from EMR and outside facility were reviewed    Hemoglobin   Date Value Ref Range Status   05/29/2021 13.8  Final     Platelet Count   Date Value Ref Range Status   05/29/2021 207  Final     Creatinine   Date Value Ref Range Status   06/22/2021 1.09 0.4 - 1.24 MG/DL Final     Potassium   Date Value Ref Range Status   06/22/2021 4.2 3.5 - 5.1 MMOL/L Final     Cholesterol   Date Value Ref Range Status   05/29/2021 140  Final     LDL   Date Value Ref Range Status   05/29/2021 57  Final     HDL   Date Value Ref Range Status   05/29/2021 71  Final     Triglycerides   Date Value Ref Range Status   05/29/2021 56  Final     Vitamin D(25-OH)Total   Date Value Ref Range  Status   01/06/2021 36.6 30 - 80 NG/ML Final     TSH   Date Value Ref Range Status   05/29/2021 1.980  Final     AST (SGOT)   Date Value Ref Range Status   06/22/2021 22 7 - 40 U/L Final     ALT (SGPT)   Date Value Ref Range Status   06/22/2021 21 7 - 56 U/L Final        Cholesterol   Date Value Ref Range Status   05/29/2021 140  Final     HDL   Date Value Ref Range Status   05/29/2021 71  Final     LDL   Date Value Ref Range Status   05/29/2021 57  Final     Triglycerides   Date Value Ref Range Status   05/29/2021 56  Final         Problem List Items Addressed This Visit        CARDIAC AND VASCULATURE    Benign hypertension    Hyperlipidemia       ENDOCRINE AND METABOLIC    Diabetes 1.5, managed as type 2 (HCC)       SYMPTOMS AND SIGNS    Syncope    Relevant Orders    2D + DOPPLER ECHO   Other Visit Diagnoses     Syncope and collapse    -  Primary    Relevant Orders    ECG 12-LEAD (Completed)    2D + DOPPLER ECHO              ASSESSMENT AND PLAN:    1.  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  He had the typical prodrome of nausea and lightheadedness and did have an episode of syncope after that.  The circumstances in which this happened with him being at mass and standing for a while and in a crowded room are all consistent with this.  This was discussed in detail with him.  He does not have any physical exam findings or other symptoms concerning for any arrhythmias.  His EKG shows no significant abnormalities.  We will get an echocardiogram to assess LV function and rule out any structural abnormalities.    Instructions for prevention of further symptoms were given:  ?  Stay well hydrated throughout the day - 1.5 - 2 L a day of water is recommended  ?  Avoid prolonged standing   ?  Start slowly progressive walking program if tolerated  ?  Avoid situations that have precipitated symptoms in the past  ?  Be careful getting up after urination or bowel movements  ?  Use isometric exercises to counteract initial symptoms - clenched fingers, crossing legs  ?  Lie down at onset of any symptoms and don't resume upright posture until symptoms have completely passed    If he does have any recurrent episodes, further testing including tilt table testing can be considered.    2.  Hypertension:  Target goal 130/80.  Blood pressure is under good control without any specific medications..   I have instructed the patient to check blood pressure regularly and keep a log of it with the date, time and the blood pressure recording and bring that log to all the clinic visits, so that appropriate changes in blood pressure medications can be made.     3.  Hypercholesterolemia  Continue moderate intensity statin therapy.    4.  Diabetes mellitus:  The importance of continued diabetes control in prevention of CAD / progression of coronary artery disease was discussed in detail.  Continue current medications.     Follow-up in 3 months.  I have asked him to call me if he has any recurrent episodes within that time so that we can schedule further testing if needed.      Thank you for allowing me to take part in this patient's care. Please not hesitate to contact me with questions or concerns.     Mady Gemma, MD, [facility], [facility]  Department of Cardiovascular Medicine             Patient was instructed to followup with PCP for routine medical care and health maintenance and screening studies.  Prescription drug management:   Patient's current medication list was reviewed and reconciled. Continue current medications with changes, if any detailed above. Refills provided as needed until next advised office visit.

## 2021-07-05 NOTE — Patient Instructions
Thank you for visiting our office today and for choosing  for your cardiac care.     We recommend follow up in three months. Please call [phone] to schedule the office visit Dr. Sarita Bottom ordered.    Please complete the following orders:    Schedule echocardiogram - schedule this at the front desk before you leave     Please continue to take your medications as ordered. Check your list with the bottles you have at home, and let us know if there are any differences.   Should you have any additional questions or concerns, please message [name] through MyChart or call the office.  If you are seen at a local hospital emergency room before you next visit, we need to know so that we can request records. Please MyChart message [name] with the name of the hospital you were see at, and why you went in.     For after-hours, weekend or holiday urgent concerns, please call the on-call provider at [phone].     Thank you,    Cardiovascular Medicine Team  Nursing voicemail line: [phone]  Fax: [phone]  Scheduling: [phone]

## 2021-07-20 ENCOUNTER — Encounter: Admit: 2021-07-20 | Discharge: 2021-07-20 | Payer: BC Managed Care – PPO

## 2021-08-01 ENCOUNTER — Ambulatory Visit: Admit: 2021-08-01 | Discharge: 2021-08-01 | Payer: BC Managed Care – PPO

## 2021-08-01 ENCOUNTER — Encounter: Admit: 2021-08-01 | Discharge: 2021-08-01 | Payer: BC Managed Care – PPO

## 2021-08-01 DIAGNOSIS — R55 Syncope and collapse: Principal | ICD-10-CM

## 2021-08-01 MED ORDER — PERFLUTREN LIPID MICROSPHERES 1.1 MG/ML IV SUSP
1-10 mL | Freq: Once | INTRAVENOUS | 0 refills | Status: CP | PRN
Start: 2021-08-01 — End: ?
  Administered 2021-08-01: 22:00:00 2 mL via INTRAVENOUS

## 2021-08-02 ENCOUNTER — Encounter: Admit: 2021-08-02 | Discharge: 2021-08-02 | Payer: BC Managed Care – PPO

## 2021-08-02 NOTE — Telephone Encounter
08/02/2021 9:01 AM   Sent patient a mychart message reviewing results and encouraging them to call with any questions or concerns.      Bapat, Adrian Blackwater, MD  P C[facility] Nurse Gen Card Team [facility]  Normal LV function. No major abnormalities. No change in management

## 2021-08-21 ENCOUNTER — Encounter: Admit: 2021-08-21 | Discharge: 2021-08-21 | Payer: BC Managed Care – PPO

## 2021-08-25 ENCOUNTER — Encounter: Admit: 2021-08-25 | Discharge: 2021-08-25 | Payer: BC Managed Care – PPO

## 2021-09-06 ENCOUNTER — Ambulatory Visit: Admit: 2021-09-06 | Discharge: 2021-09-07 | Payer: BC Managed Care – PPO

## 2021-09-06 ENCOUNTER — Encounter: Admit: 2021-09-06 | Discharge: 2021-09-06 | Payer: BC Managed Care – PPO

## 2021-09-06 DIAGNOSIS — K219 Gastro-esophageal reflux disease without esophagitis: Secondary | ICD-10-CM

## 2021-09-06 DIAGNOSIS — I10 Essential (primary) hypertension: Secondary | ICD-10-CM

## 2021-09-06 DIAGNOSIS — E782 Mixed hyperlipidemia: Secondary | ICD-10-CM

## 2021-09-06 DIAGNOSIS — E538 Deficiency of other specified B group vitamins: Secondary | ICD-10-CM

## 2021-09-06 DIAGNOSIS — I951 Orthostatic hypotension: Secondary | ICD-10-CM

## 2021-09-06 DIAGNOSIS — M069 Rheumatoid arthritis, unspecified: Secondary | ICD-10-CM

## 2021-09-06 DIAGNOSIS — R55 Syncope and collapse: Secondary | ICD-10-CM

## 2021-09-06 DIAGNOSIS — E139 Other specified diabetes mellitus without complications: Secondary | ICD-10-CM

## 2021-09-06 DIAGNOSIS — M1990 Unspecified osteoarthritis, unspecified site: Secondary | ICD-10-CM

## 2021-09-06 DIAGNOSIS — E213 Hyperparathyroidism, unspecified: Secondary | ICD-10-CM

## 2021-09-06 DIAGNOSIS — E785 Hyperlipidemia, unspecified: Secondary | ICD-10-CM

## 2021-09-06 DIAGNOSIS — E119 Type 2 diabetes mellitus without complications: Secondary | ICD-10-CM

## 2021-09-06 MED ORDER — AMLODIPINE 5 MG PO TAB
2.5 mg | ORAL_TABLET | Freq: Every evening | ORAL | 0 refills | Status: AC
Start: 2021-09-06 — End: ?

## 2021-09-06 MED ORDER — FLUDROCORTISONE 0.1 MG PO TAB
0.1 mg | ORAL_TABLET | Freq: Every day | ORAL | 1 refills | 90.00000 days | Status: DC
Start: 2021-09-06 — End: 2021-09-06

## 2021-09-06 MED ORDER — FLUDROCORTISONE 0.1 MG PO TAB
0.1 mg | ORAL_TABLET | Freq: Every day | ORAL | 0 refills | 90.00000 days | Status: AC
Start: 2021-09-06 — End: ?

## 2021-09-06 NOTE — Progress Notes
09/06/21 1140 Pt stopped by clinic with request to make an appointment d/t syncopal episode on Sunday during church. Pt taken to room to further triage. Pt reports happened during church service on Sunday when he felt dizziness about 1 min prior to passing out. Pt denies any irregularlity or racing of the heart. Denies any CP or SOB. Reports he was told it was about 30 secs of him being out of it and came to rather quickly. He reports EMS was called but didn't go to hospital for further eval. Pt reports BS was checked and was 143 and EKG showed SR, HR 65. Pt has abrasion to upper left eye brow area with some bruising noted. Pt doesn't monitor BP/HR at home. He feels he is staying well hydrated. Denies any symptoms currently. Pt taken to room and BP 118/78 HR 96 sitting and standing BP 96/72 with HR 116. Pt denies any changes with medications. Reports he has drank well this morning and already ate lunch. Spoke with AVB and agreeable to see pt today at 2pm. Pt voiced understanding and agreeable to plan.

## 2021-09-06 NOTE — Progress Notes
Date of Service: 09/06/2021      CHIEF COMPLAINT: Recent episode of syncope.  Walk-in to the office today.         HISTORY OF PRESENT ILLNESS:  Philip Gilbert is a 61 y.o. male.     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  I had seen him in January for further evaluation of episodes of syncope and it was felt that he likely had neurocardiogenic syncope.  Lifestyle modifications were recommended.  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  He started to feel lightheaded and then sat down for a little bit and drank some water.  He felt better and then he got up to start preaching again and then had a syncopal episode where he fell flat on his face and injured his forehead.  He states that he came to within 20 to 30 seconds and felt well afterwards.  He has not had any recurrence of his episodes since.    PMH (CARDIAC AND RELATED):  Hypertension  Hypercholesterolemia  Type 2 diabetes.  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  Secondary hyperparathyroidism.  Rheumatoid arthritis    CARDIOVASCULAR TESTS    ECHOCARDIOGRAM  August 01, 2021.  Normal LV function with concentric remodeling and EF of 60%.  No wall motion abnormalities.  Normal diastolic function.  Normal right ventricular systolic function.  Sclerotic aortic valve.  No hemodynamically significant valvular abnormalities.  Normal biatrial sizes.     EKG  07/05/2021.  Ordered and reviewed.  Normal sinus rhythm.  No pathologic Q-waves.  No evidence of LVH.  No significant ST-T wave changes         CURRENT MEDICATIONS: (including today's revisions)    ? ALPRAZolam (XANAX) 0.5 mg tablet Take one tablet by mouth twice daily as needed.   ? amLODIPine (NORVASC) 5 mg tablet Take one-half tablet by mouth at bedtime daily.   ? aspirin EC 81 mg tablet Take one tablet by mouth daily.   ? buPROPion XL (WELLBUTRIN XL) 300 mg tablet Take one tablet by mouth every morning. Do not crush or chew.   ? celecoxib (CELEBREX) 200 mg capsule Take one capsule by mouth daily.   ? cyanocobalamin 1,000 mcg tablet Take one tablet by mouth twice daily.   ? doxycycline (MONODOX) 100 mg capsule Take one capsule by mouth daily.   ? ergocalciferol (vitamin D2) (VITAMIN D PO) Take 2,000 Units by mouth daily.   ? eszopiclone (LUNESTA) 3 mg tablet Take one tablet by mouth nightly as needed.   ? ezetimibe (ZETIA) 10 mg tablet Take one tablet by mouth daily.   ? finasteride (PROPECIA) 1 mg tablet Take one tablet by mouth daily.   ? fludrocortisone (FLORINEF) 0.1 mg tablet Take one tablet by mouth daily.   ? folic acid (FOLVITE) 1 mg tablet Take one tablet by mouth daily.   ? gabapentin (NEURONTIN) 600 mg tablet TAKE 1 TABLET BY MOUTH TWICE DAILY   ? hydrOXYchloroQUINE (PLAQUENIL) 200 mg tablet Take two tablets by mouth daily.   ? metFORMIN (GLUCOPHAGE) 500 mg tablet Take two tablets by mouth twice daily after meals.   ? methotrexate sodium (RHEUMATREX) 2.5 mg tablet TAKE 8 TABLETS BY MOUTH EVERY WEEK   ? omeprazole DR (PRILOSEC) 40 mg capsule TAKE 1 CAPSULE BY MOUTH  DAILY   ? promethazine-codeine (PHENERGAN W/CODEINE) 6.25-10 mg/5 mL oral syrup Take 5 mL by mouth every 6 hours as needed for Cough.   ? rosuvastatin (CRESTOR) 20 mg tablet Take one tablet by mouth daily.   ? semaglutide (OZEMPIC) 1 mg/dose (4 mg/3 mL) injection PEN Inject one mg under the skin every 7 days.   ? silodosin (RAPAFLO) 8 mg capsule Take one capsule by mouth daily.   ? traMADoL (ULTRAM) 50 mg tablet Take one tablet by mouth at bedtime daily.       ALLERGIES:  Allergies   Allergen Reactions   ? Ropinirole FLUSHING (SKIN), PHOTOSENSITIVITY, SWEATING and DIZZINESS     Other reaction(s): Lightheadedness         Vitals:    09/06/21 1407 09/06/21 1408   BP: 118/78 96/72   BP Source: Arm, Left Upper Arm, Left Upper   Pulse: 96 116   PainSc: Zero    Weight: 79.1 kg (174 lb 4.8 oz)    Height: 175.3 cm (5' 9)      Body mass index is 25.74 kg/m?Marland Kitchen  Body surface area is 1.96 meters squared.    PHYSICAL EXAMINATION:  GENERAL: Patient is resting comfortably, in no apparent distress, alert and oriented.   NECK: No jugular venous distention.    CHEST: Chest is clear to auscultation bilaterally with no wheezes, crackles or rhonchi.  CARDIOVASCULAR: S1 and S2 are normal and regular.  No significant murmur auscultated.   EXTREMITIES: No edema bilaterally.      LABS:  Available and relevant labs from EMR and outside facility were reviewed      Hemoglobin   Date Value Ref Range Status   07/24/2021 13.5 (L)  Final     Platelet Count   Date Value Ref Range Status   07/24/2021 232  Final     Creatinine   Date Value Ref Range Status   07/24/2021 1.0  Final     Potassium   Date Value Ref Range Status   06/22/2021 4.2 3.5 - 5.1 MMOL/L Final     Cholesterol   Date Value Ref Range Status   05/29/2021 140  Final     LDL   Date Value Ref Range Status   05/29/2021 57  Final     HDL   Date Value Ref Range Status   05/29/2021 71  Final     Triglycerides   Date Value Ref Range Status   05/29/2021 56  Final     Vitamin D(25-OH)Total   Date Value Ref Range Status   01/06/2021 36.6 30 - 80 NG/ML Final     TSH   Date Value Ref Range Status   05/29/2021 1.980  Final     AST (SGOT)   Date Value Ref Range Status   07/24/2021 23  Final     ALT (SGPT)   Date Value Ref Range Status   07/24/2021 25  Final        Cholesterol   Date Value Ref Range Status   05/29/2021 140  Final     HDL   Date Value Ref Range Status   05/29/2021 71  Final     LDL   Date Value Ref Range Status   05/29/2021 57  Final     Triglycerides   Date Value Ref Range Status   05/29/2021 56  Final              ASSESSMENT:    1. Vasovagal syncope  2. Orthostatic hypotension    3. Benign hypertension    4. Mixed hyperlipidemia    5. Diabetes 1.5, managed as type 2 (HCC)    6. Rheumatoid arthritis involving multiple sites, unspecified whether rheumatoid factor present (HCC)        PLAN and RECOMMENDATIONS:    1.  Neurocardiogenic syncope/orthostatic hypotension/hypertension  The patient does definitively have orthostatic hypotension with appropriate increase in heart rate with the decrease in blood pressure.  His baseline blood pressure is also low.  Given his recent episode of syncope with minor injury, more aggressive treatment is indicated.  He has followed recommendations for increased fluid intake.  I am going to decrease his amlodipine to 2.5 mg daily and have him take this at night.  In addition I am going to start him on Florinef at 0.1 mg daily to be taken in the morning.  Continue aggressive fluid intake.  I have asked him to monitor his blood pressures at home and call me with readings over the next few weeks.  I discussed with him that Florinef could potentially increase supine hypertension but if his blood pressure is still low on the combination of the medication, he may need to discontinue the amlodipine.  He has lost quite a bit of weight after his gastric bypass surgery as well.    2.  Hypercholesterolemia:  Target goal : Moderate intensity statin and/or LDL <70.  Lipids are under good control with a combination of Crestor and Zetia which she has tolerated well.  Patient will continue to have ongoing lab work to confirm adequate control and for monitoring of secondary complications.  Lifestyle management and regular exercise to maintain optimal weight was discussed.    3.  Diabetes mellitus  Continue current medications.    He already has a scheduled follow-up with me in April.  He has been asked to call if he has any further episodes in the meantime.        Thank you for allowing me to take part in this patient's care. Please not hesitate to contact me with questions or concerns.     Mady Gemma, MD, [facility], [facility]ASE  Department of Cardiovascular Medicine               Patient was instructed to followup with PCP for routine medical care and health maintenance and screening studies.  Prescription drug management:   Patient's current medication list was reviewed and reconciled. Continue current medications with changes, if any detailed above. Refills provided as needed until next advised office visit.    Patient Instructions   Increase hydration    Decrease amlodipine to 2.5mg  (half tab) daily at evening/bedtime    Start florinef 0.1mg  daily in the morning    Check blood pressures readings and call in 2 weeks with update/review-[phone]    Keep follow up appointment on 4/25 at [facility] office        If you have any non urgent questions or concerns, call nursing team at [phone]     For after-hours, weekend or holiday urgent concerns, please call the on-call provider at [phone].     To schedule or change an appointment, please call [phone]    Thank you for visiting our office today and for choosing [facility] for your cardiac care.     Cardiovascular Medicine Team  Nursing voicemail line: [phone]  Fax: [phone]  Scheduling: [phone]

## 2021-09-20 ENCOUNTER — Encounter: Admit: 2021-09-20 | Discharge: 2021-09-20 | Payer: BC Managed Care – PPO

## 2021-09-20 NOTE — Telephone Encounter
09/20/2021 1:11 PM   Called and spoke to [place]. He reports that he bought a blood pressure cuff online and received it yesterday. Doesn't have any readings to report at this time. Pt reports that he is still having "minor spells" - describes needing to pause when he gets up and move slowly as he acclimates. Pt denies any recent falls or syncope episodes. Pt has been cutting amlodipine in half. Pt reports that he will call back in a few days with blood pressure readings to see if he can come off of the amlodipine altogether.     Rosezena Sensor, BSN  P Cvm Nurse Gen Card Team [facility]  Pt call back with BP update. Syncopal episodes. 3/22 OV decrease amlodipine and adding florinef 0.1mg  daily. Pt was to watch BP over next few wks and call back with update.     F/u appt on 4/25 with AVB.

## 2021-10-06 ENCOUNTER — Encounter: Admit: 2021-10-06 | Discharge: 2021-10-06 | Payer: BC Managed Care – PPO

## 2021-10-10 ENCOUNTER — Encounter: Admit: 2021-10-10 | Discharge: 2021-10-10 | Payer: BC Managed Care – PPO

## 2021-10-10 ENCOUNTER — Ambulatory Visit: Admit: 2021-10-10 | Discharge: 2021-10-11 | Payer: BC Managed Care – PPO

## 2021-10-10 DIAGNOSIS — I10 Essential (primary) hypertension: Secondary | ICD-10-CM

## 2021-10-10 DIAGNOSIS — M1990 Unspecified osteoarthritis, unspecified site: Secondary | ICD-10-CM

## 2021-10-10 DIAGNOSIS — I951 Orthostatic hypotension: Secondary | ICD-10-CM

## 2021-10-10 DIAGNOSIS — E782 Mixed hyperlipidemia: Secondary | ICD-10-CM

## 2021-10-10 DIAGNOSIS — E538 Deficiency of other specified B group vitamins: Secondary | ICD-10-CM

## 2021-10-10 DIAGNOSIS — E785 Hyperlipidemia, unspecified: Secondary | ICD-10-CM

## 2021-10-10 DIAGNOSIS — E213 Hyperparathyroidism, unspecified: Secondary | ICD-10-CM

## 2021-10-10 DIAGNOSIS — K219 Gastro-esophageal reflux disease without esophagitis: Secondary | ICD-10-CM

## 2021-10-10 DIAGNOSIS — E119 Type 2 diabetes mellitus without complications: Secondary | ICD-10-CM

## 2021-10-10 DIAGNOSIS — R55 Syncope and collapse: Secondary | ICD-10-CM

## 2021-10-10 DIAGNOSIS — M069 Rheumatoid arthritis, unspecified: Secondary | ICD-10-CM

## 2021-10-10 NOTE — Progress Notes
Date of Service: 10/10/2021      CHIEF COMPLAINT: Neurogenic syncope         HISTORY OF PRESENT ILLNESS:  Philip Gilbert is a 61 y.o. male.     He is here today for a routinely scheduled follow-up.  Since his last visit, he has done well.  He has not had any further episodes of presyncope or syncope.  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  This is a definite change from his prior episodes.  He denies any new symptoms of chest pain or dyspnea with activity.    PMH (CARDIAC AND RELATED):    Hypertension  Hypercholesterolemia  Type 2 diabetes.  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  Secondary hyperparathyroidism.  Rheumatoid arthritis    CARDIOVASCULAR TESTS    ECHOCARDIOGRAM  August 01, 2021.  Normal LV function with concentric remodeling and EF of 60%.  No wall motion abnormalities.  Normal diastolic function.  Normal right ventricular systolic function.  Sclerotic aortic valve.  No hemodynamically significant valvular abnormalities.  Normal biatrial sizes.     EKG  07/05/2021. ?Ordered and reviewed.  Normal sinus rhythm. ?No pathologic Q-waves. ?No evidence of LVH. ?No significant ST-T wave changes  ?       CURRENT MEDICATIONS: (including today's revisions)    ? ALPRAZolam (XANAX) 0.5 mg tablet Take one tablet by mouth twice daily as needed.   ? amLODIPine (NORVASC) 5 mg tablet Take one-half tablet by mouth at bedtime daily.   ? aspirin EC 81 mg tablet Take one tablet by mouth daily.   ? buPROPion XL (WELLBUTRIN XL) 300 mg tablet Take one tablet by mouth every morning. Do not crush or chew.   ? celecoxib (CELEBREX) 200 mg capsule Take one capsule by mouth daily.   ? cyanocobalamin 1,000 mcg tablet Take one tablet by mouth twice daily.   ? doxycycline (MONODOX) 100 mg capsule Take one capsule by mouth daily.   ? ergocalciferol (vitamin D2) (VITAMIN D PO) Take 2,000 Units by mouth daily.   ? eszopiclone (LUNESTA) 3 mg tablet Take one tablet by mouth nightly as needed.   ? ezetimibe (ZETIA) 10 mg tablet Take one tablet by mouth daily.   ? finasteride (PROPECIA) 1 mg tablet Take one tablet by mouth daily.   ? fludrocortisone (FLORINEF) 0.1 mg tablet Take one tablet by mouth daily.   ? folic acid (FOLVITE) 1 mg tablet Take one tablet by mouth daily.   ? gabapentin (NEURONTIN) 600 mg tablet TAKE 1 TABLET BY MOUTH TWICE DAILY   ? hydrOXYchloroQUINE (PLAQUENIL) 200 mg tablet Take two tablets by mouth daily.   ? metFORMIN (GLUCOPHAGE) 500 mg tablet Take two tablets by mouth twice daily after meals.   ? methotrexate sodium (RHEUMATREX) 2.5 mg tablet TAKE 8 TABLETS BY MOUTH EVERY WEEK   ? omeprazole DR (PRILOSEC) 40 mg capsule TAKE 1 CAPSULE BY MOUTH DAILY   ? promethazine-codeine (PHENERGAN W/CODEINE) 6.25-10 mg/5 mL oral syrup Take 5 mL by mouth every 6 hours as needed for Cough.   ? rosuvastatin (CRESTOR) 20 mg tablet Take one tablet by mouth daily.   ? semaglutide (OZEMPIC) 1 mg/dose (4 mg/3 mL) injection PEN Inject one mg under the skin every 7 days.   ? silodosin (RAPAFLO) 8 mg capsule Take one capsule by mouth daily.   ?  traMADoL (ULTRAM) 50 mg tablet Take one tablet by mouth at bedtime daily.       ALLERGIES:  Allergies   Allergen Reactions   ? Ropinirole FLUSHING (SKIN), PHOTOSENSITIVITY, SWEATING and DIZZINESS     Other reaction(s): Lightheadedness         Vitals:    10/10/21 1004 10/10/21 1009   BP: 108/68 102/72   BP Source: Arm, Left Upper Arm, Left Upper   Pulse: 86    SpO2: 98%    PainSc: Zero    Weight: 81.5 kg (179 lb 11.2 oz)    Height: 175.3 cm (5' 9)      Body mass index is 26.54 kg/m?Marland Kitchen  Body surface area is 1.99 meters squared.    PHYSICAL EXAMINATION:  GENERAL: Patient is resting comfortably, in no apparent distress, alert and oriented.   NECK: No jugular venous distention.    CHEST: Chest is clear to auscultation bilaterally with no wheezes, crackles or rhonchi.  CARDIOVASCULAR: S1 and S2 are normal and regular.  No murmur auscultated.   EXTREMITIES: No edema bilaterally.      LABS:  Available and relevant labs from EMR and outside facility were reviewed      Hemoglobin   Date Value Ref Range Status   07/24/2021 13.5 (L)  Final     Platelet Count   Date Value Ref Range Status   07/24/2021 232  Final     Creatinine   Date Value Ref Range Status   08/31/2021 1.01  Final     Potassium   Date Value Ref Range Status   08/31/2021 4.5  Final     Vitamin D(25-OH)Total   Date Value Ref Range Status   01/06/2021 36.6 30 - 80 NG/ML Final     TSH   Date Value Ref Range Status   08/31/2021 1.350  Final     AST (SGOT)   Date Value Ref Range Status   08/31/2021 44 (H)  Final     ALT (SGPT)   Date Value Ref Range Status   08/31/2021 45 (H)  Final        Cholesterol   Date Value Ref Range Status   05/29/2021 140  Final     HDL   Date Value Ref Range Status   05/29/2021 71  Final     LDL   Date Value Ref Range Status   05/29/2021 57  Final     Triglycerides   Date Value Ref Range Status   05/29/2021 56  Final              ASSESSMENT:    1. Neurocardiogenic syncope    2. Orthostatic hypotension    3. Mixed hyperlipidemia    4. Benign hypertension        PLAN and RECOMMENDATIONS:    1.  Neurocardiogenic syncope/orthostatic hypotension  The patient did have significant orthostatic changes on evaluation at the time of his last office visit with decrease in blood pressure and increase in heart rate.  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  We decreased dose of amlodipine and added Florinef to his regimen and he has done well with this without any recurrent symptoms and orthostatic blood pressure readings today do not show any significant change.  Continue current regimen.  He has not developed any lower extremity edema or worsening hypertension with this.    2.  Hypertension:  Target goal 130/80.  Home blood pressure  readings were evaluated and his average systolic blood pressure is 124.  No changes at this time.   Current regimen has been well tolerated.    I have instructed the patient to check blood pressure regularly and keep a log of it with the date, time and the blood pressure recording and bring that log to all the clinic visits, so that appropriate changes in blood pressure medications can be made.     3.  Hypercholesterolemia  Not on statin therapy at this point.    4.  Obesity  S/p gastric bypass surgery in the past.  On Ozempic as well.    Follow-up in 6 months, earlier if needed.    Thank you for allowing me to take part in this patient's care. Please not hesitate to contact me with questions or concerns.     Mady Gemma, MD, [facility], [facility]  Department of Cardiovascular Medicine               Patient was instructed to followup with PCP for routine medical care and health maintenance and screening studies.  Prescription drug management:   Patient's current medication list was reviewed and reconciled. Continue current medications with changes, if any detailed above. Refills provided as needed until next advised office visit.    There are no Patient Instructions on file for this visit.

## 2021-11-02 IMAGING — MR MR KNEE^[PERSON_NAME] KNEE
5 series · 36 of 40 positions shown · non-contrast
Comparison: none

[Series 3: t2_tse_fs_ax fs · axial · 4.0mm · 0.31mm/px · z∈[-82,+28]mm · 7 of 24 slices shown]
[im 1/24]
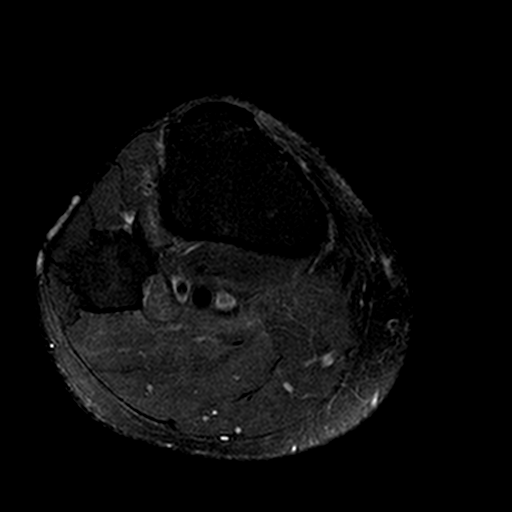
[im 4/24]
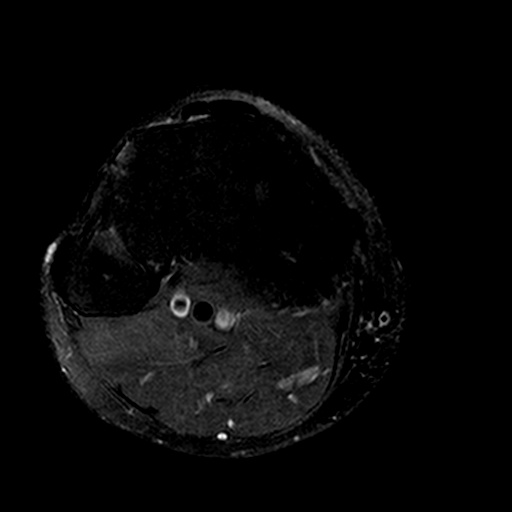
[im 8/24]
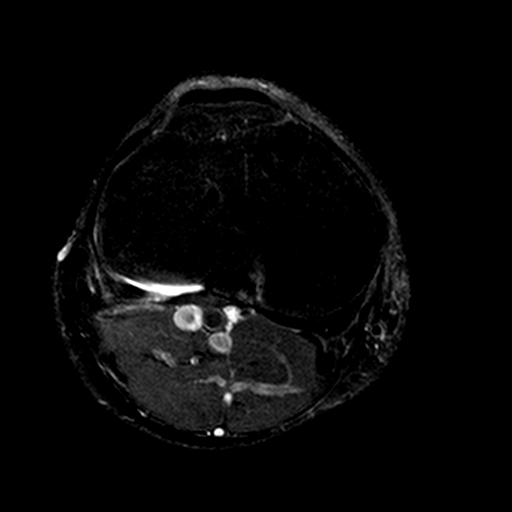
[im 12/24]
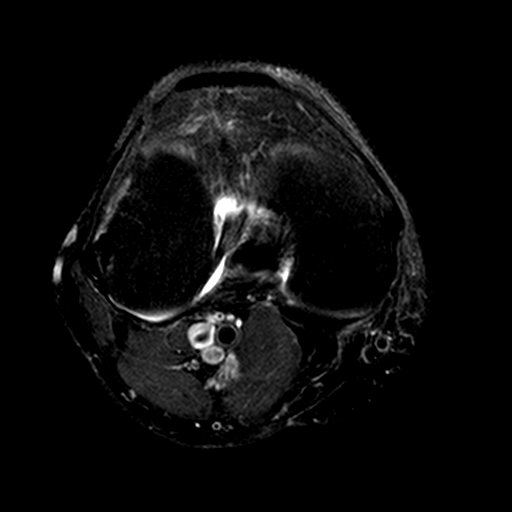
[im 16/24]
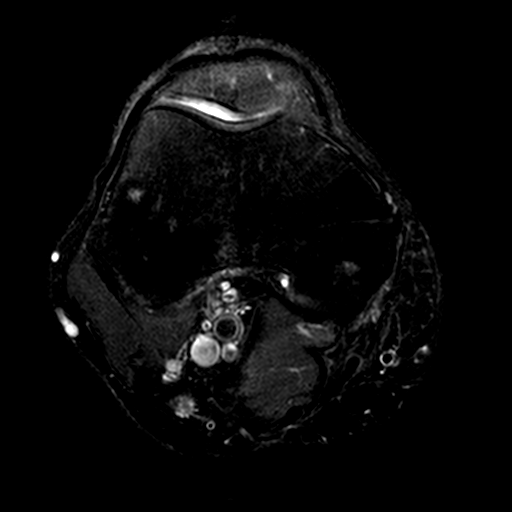
[im 20/24]
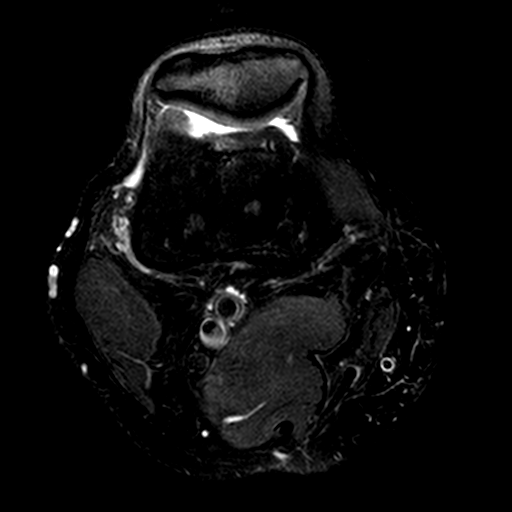
[im 24/24]
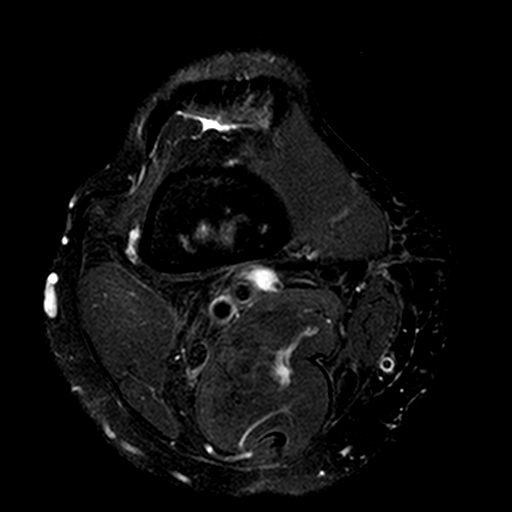

[Series 4: T1 · axial · 4.0mm · 0.62mm/px · z∈[-93,+31]mm · 7 of 25 slices shown]
[im 1/25]
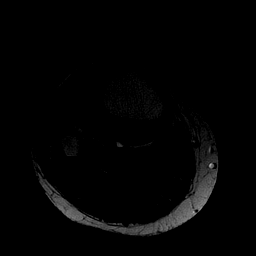
[im 5/25]
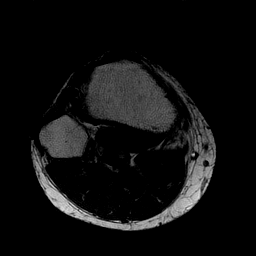
[im 9/25]
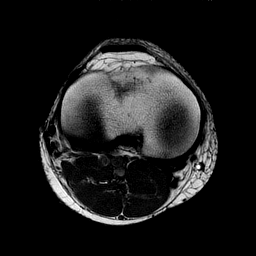
[im 13/25]
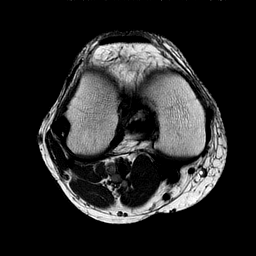
[im 17/25]
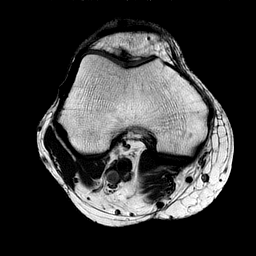
[im 21/25]
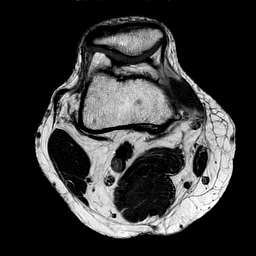
[im 25/25]
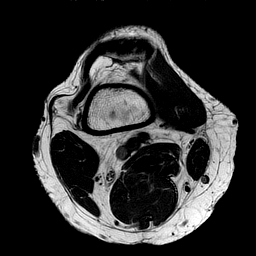

[Series 5: t2_tse_fs_cor fs · coronal · 3.5mm · 0.62mm/px · 8 of 28 slices shown]
[im 1/28]
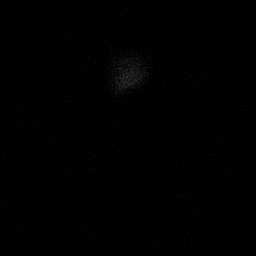
[im 4/28]
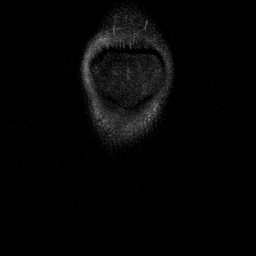
[im 8/28]
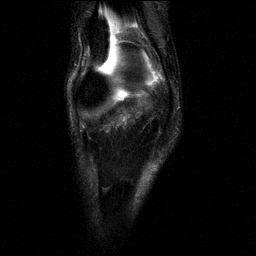
[im 12/28]
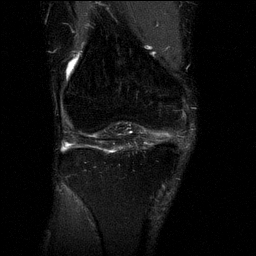
[im 16/28]
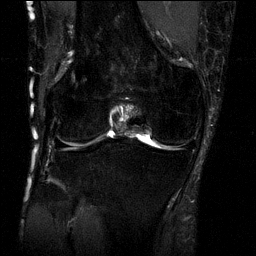
[im 20/28]
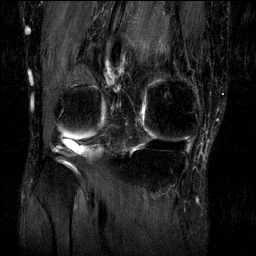
[im 24/28]
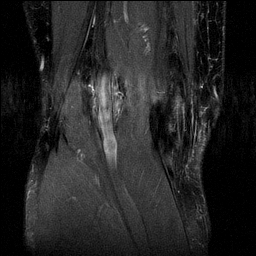
[im 28/28]
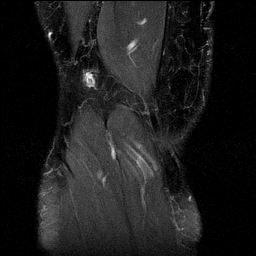

[Series 6: t2_tse_sag fs · sagittal · 3.5mm · 0.59mm/px · 9 of 30 slices shown]
[im 1/30]
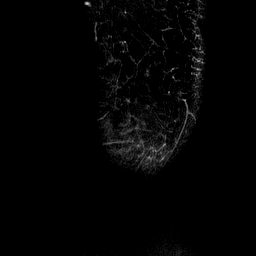
[im 4/30]
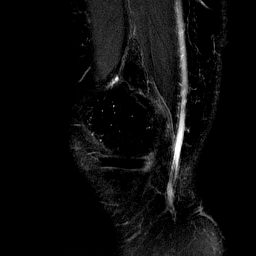
[im 8/30]
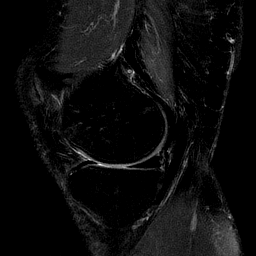
[im 11/30]
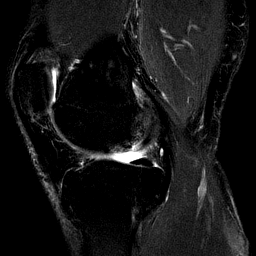
[im 15/30]
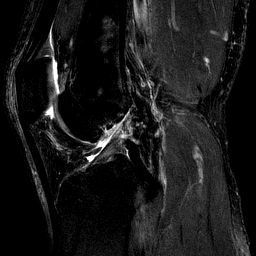
[im 19/30]
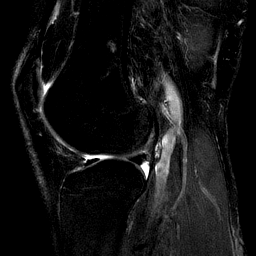
[im 22/30]
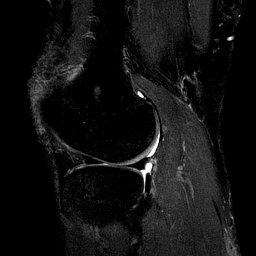
[im 26/30]
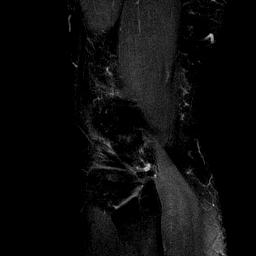
[im 30/30]
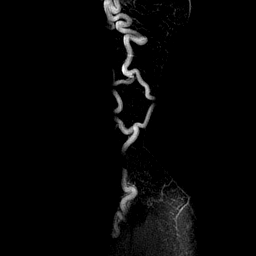

[Series 7: pd_tse_sag fat sat · sagittal · 3.0mm · 0.34mm/px · 5 of 30 slices shown]
[im 1/30]
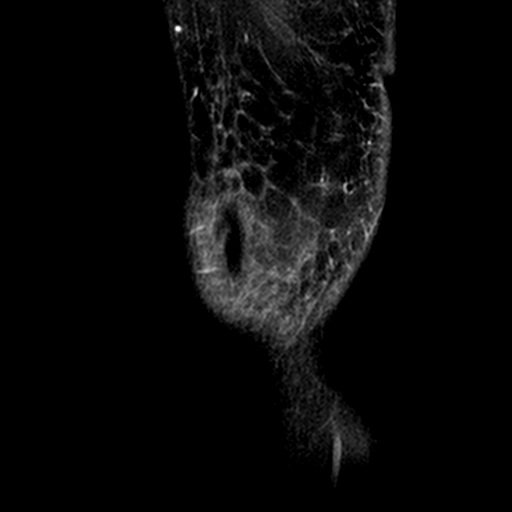
[im 4/30]
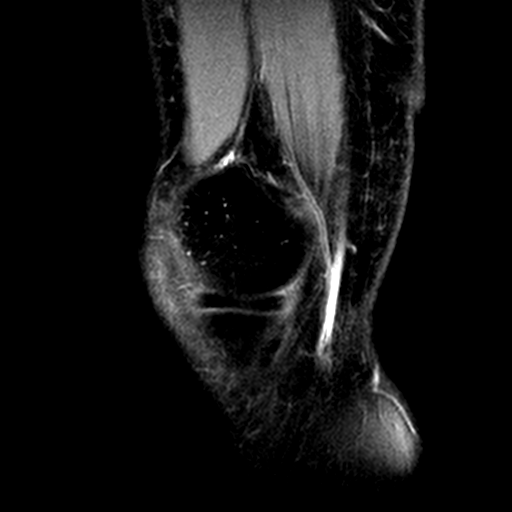
[im 8/30]
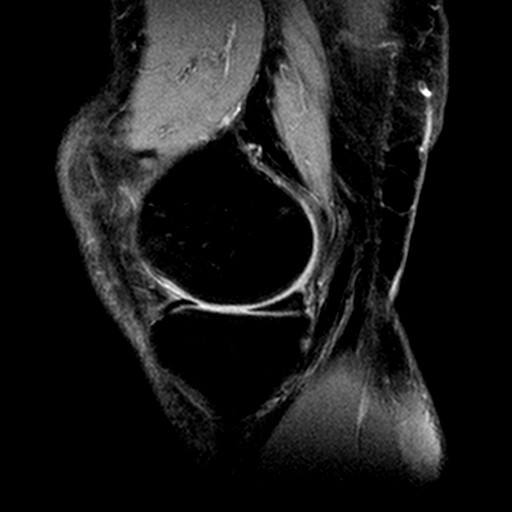
[im 11/30]
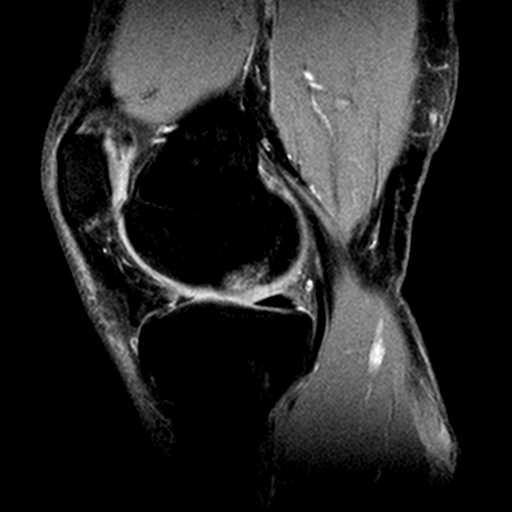
[im 19/30]
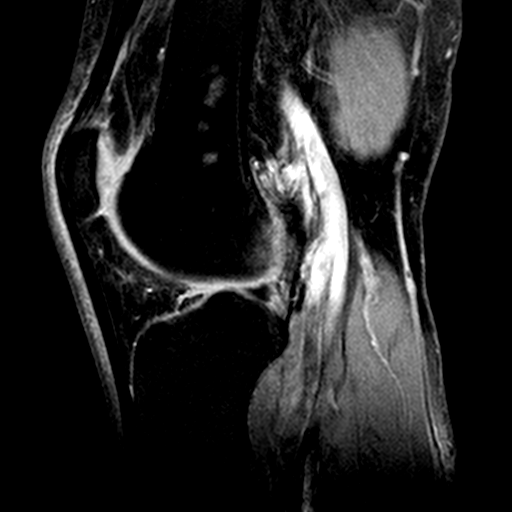

[36 of 40 positions shown; findings below may reference images not displayed]

11/02/21

DIAGNOSTIC STUDIES

EXAM

MRI of right knee without contrast.

INDICATION

Rt knee pain
RT KNEE PAIN X 6 WEEKS, JUST ABOVE PATELLA, WORSE WITH BENDING.  RG

TECHNIQUE

Sagittal images obtained variable T1 and T2 weighting.

COMPARISONS

None available

FINDINGS

Quadriceps and patellar tendons are normal. There is mild prepatellar soft tissue edema. The
articular cartilage of the patellofemoral joint space is well preserved. Small knee effusion is
evident.

The anterior and posterior cruciate ligaments are normal.

The medial and lateral collateral ligaments are normal.

The menisci are unremarkable without evidence tear.

IMPRESSION

Small knee effusion and minimal prepatellar soft tissue edema may indicate prepatellar bursitis.
Symptoms of infection or recent trauma should be excluded clinically.

Tech Notes:

RT KNEE PAIN X 6 WEEKS, JUST ABOVE PATELLA, WORSE WITH BENDING.  RG

## 2021-11-15 ENCOUNTER — Encounter: Admit: 2021-11-15 | Discharge: 2021-11-15 | Payer: BC Managed Care – PPO

## 2021-11-15 MED ORDER — FLUDROCORTISONE 0.1 MG PO TAB
0.1 mg | ORAL_TABLET | Freq: Every day | ORAL | 1 refills | 90.00000 days | Status: AC
Start: 2021-11-15 — End: ?

## 2021-11-24 IMAGING — CR [ID]
3 series · 3 of 3 positions shown · non-contrast
Comparison: none

[hand pa]
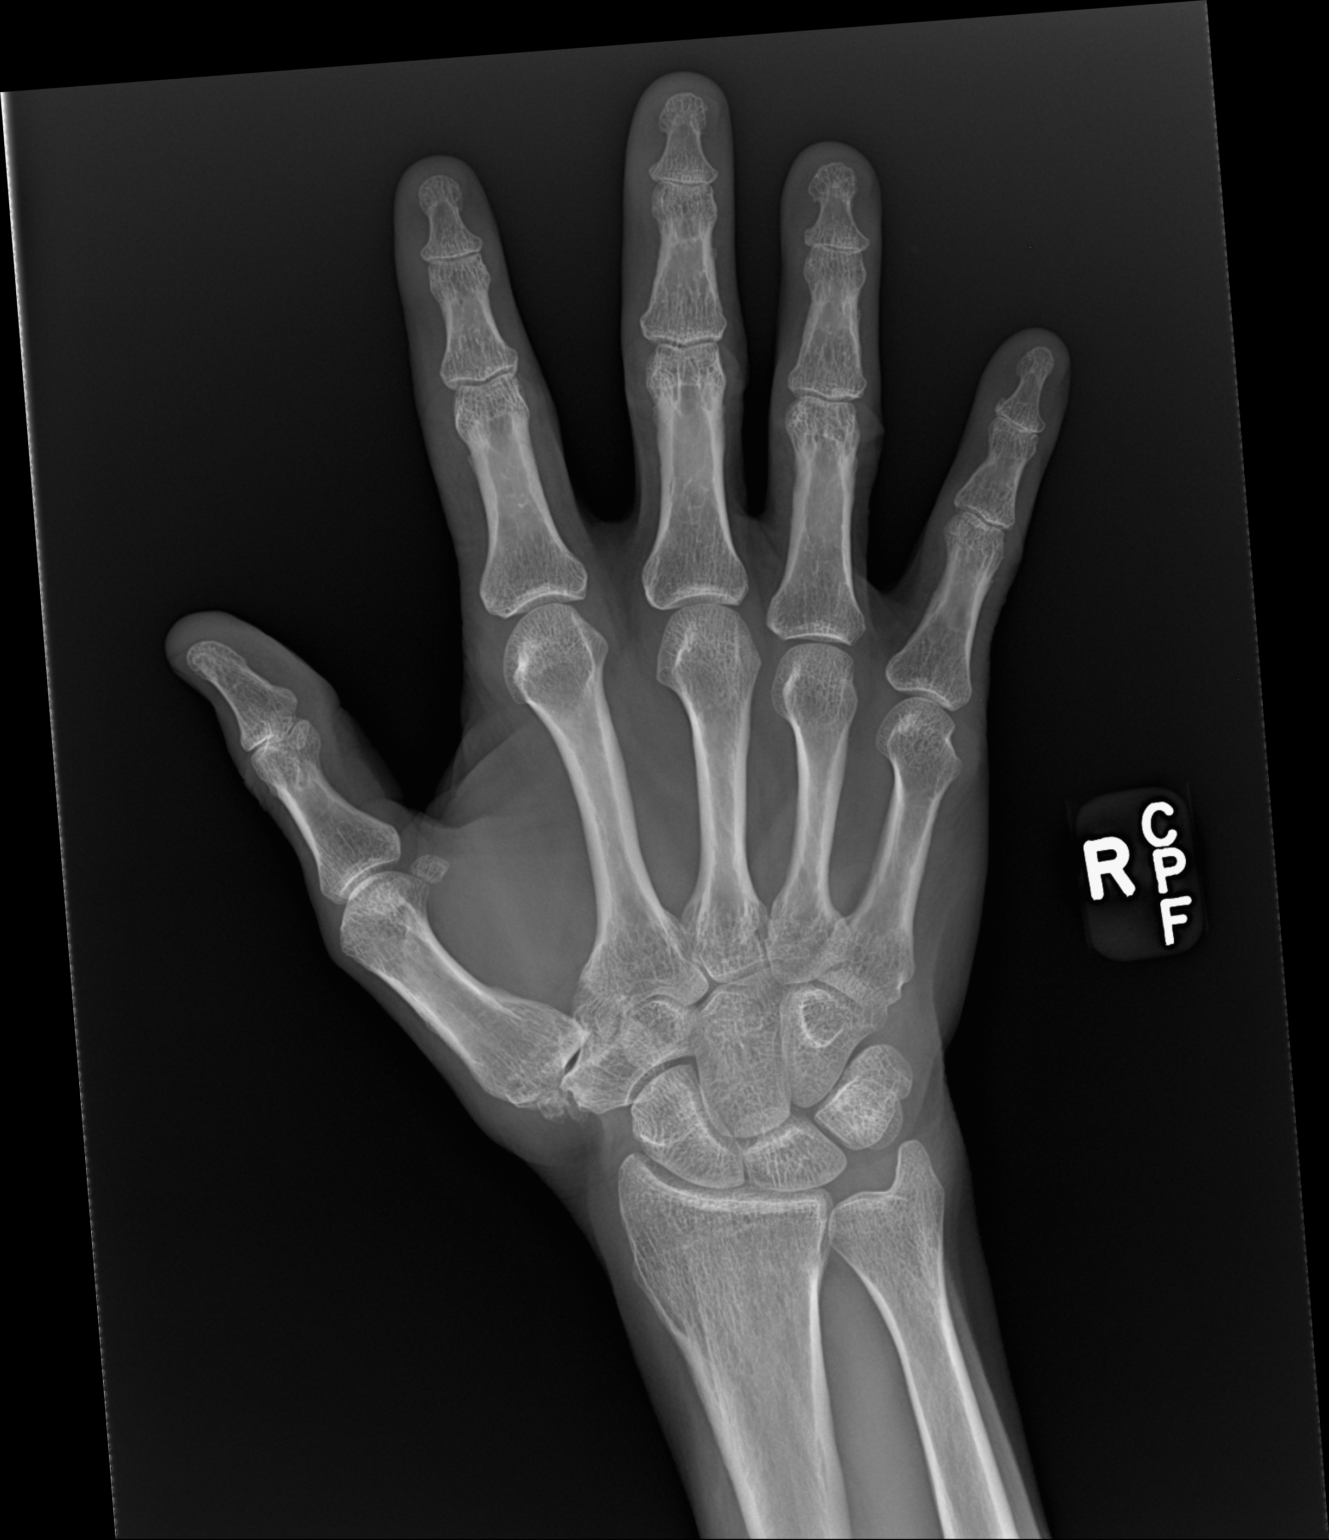

[hand obl]
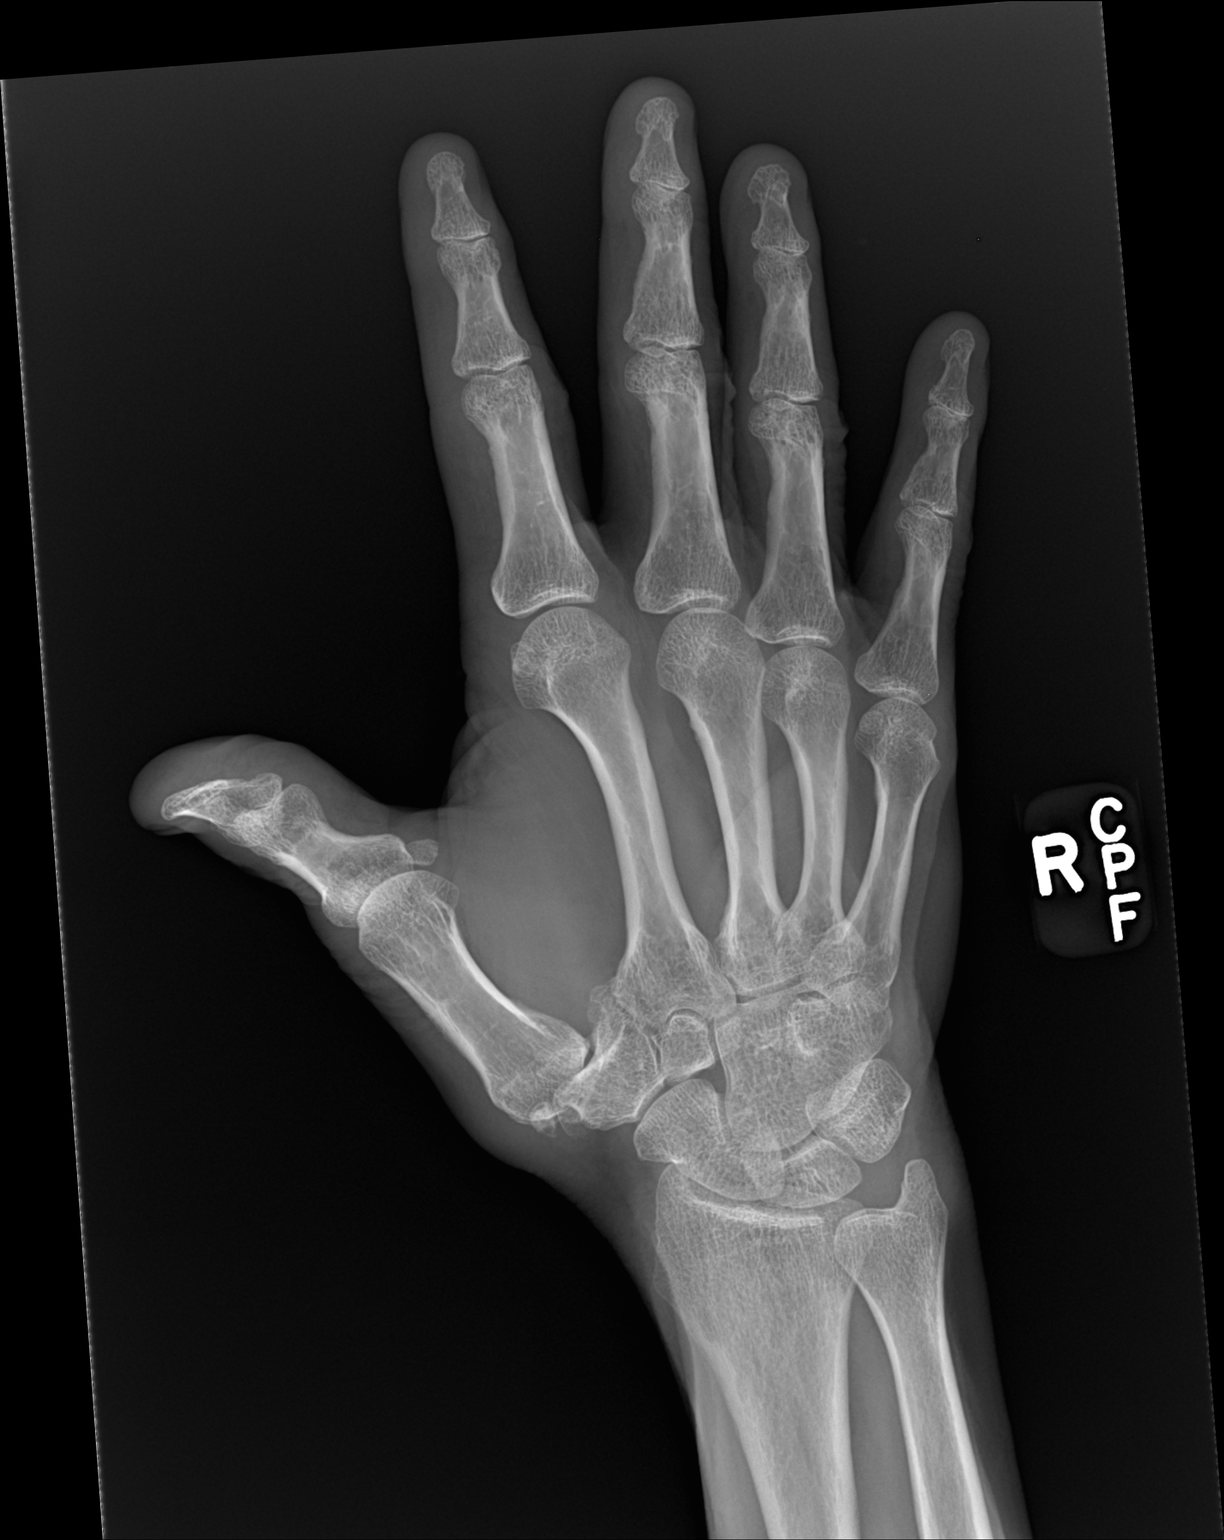

[hand lat]
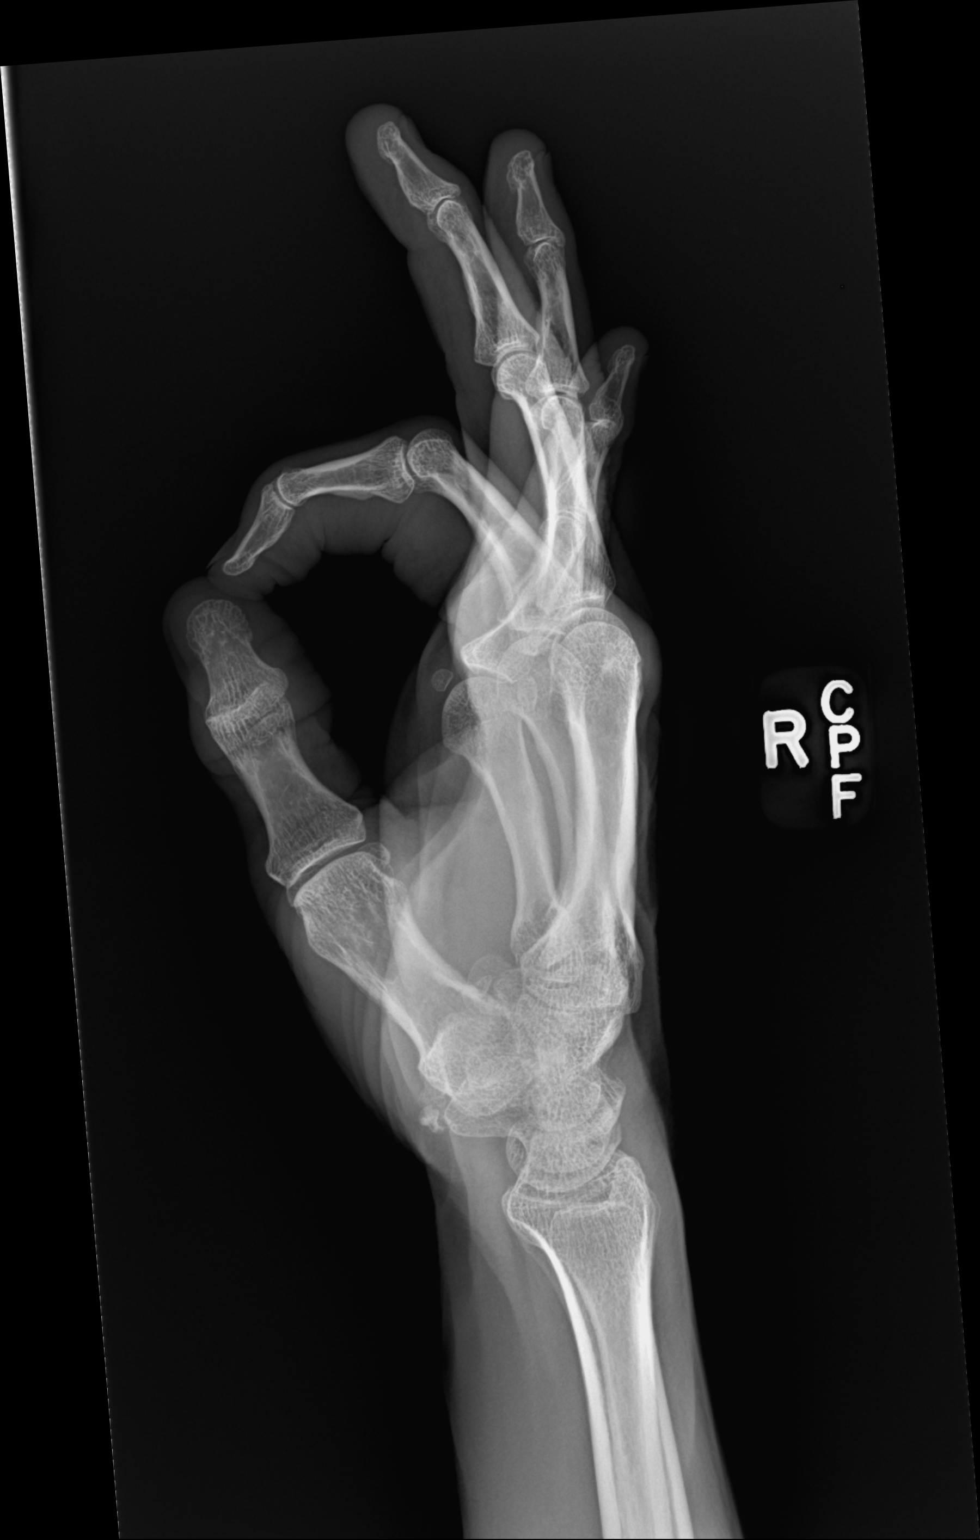

[3 of 3 positions shown; findings below may reference images not displayed]

11/24/21

DIAGNOSTIC STUDIES

EXAM

Three views bilateral hands

INDICATION

hand xray
Pt c/o bilateral hand pain. Arthritis. CF

TECHNIQUE

Three views bilateral hands

COMPARISONS

None

FINDINGS

Left hand: There is no acute fracture or dislocation. There is moderate 1st carpometacarpal joint
osteoarthritis. Corticated chronic appearing osseous fragment along the 5th digit proximal Clariss
[place] base. Normal osseous mineralization.

Right hand: There is no fracture or dislocation. Moderate 1st carpometacarpal joint osteoarthritis.
Normal osseous mineralization.

IMPRESSION

Moderate bilateral 1st carpometacarpal joint osteoarthritis.

Corticated chronic appearing fragment along the left 5th proximal phalangeal base. Correlate with
point tenderness.

Tech Notes:

Pt c/o bilateral hand pain. Arthritis. CF

## 2021-11-24 IMAGING — CR PAPERORDER
3 series · 3 of 3 positions shown · non-contrast
Comparison: none

[hand pa]
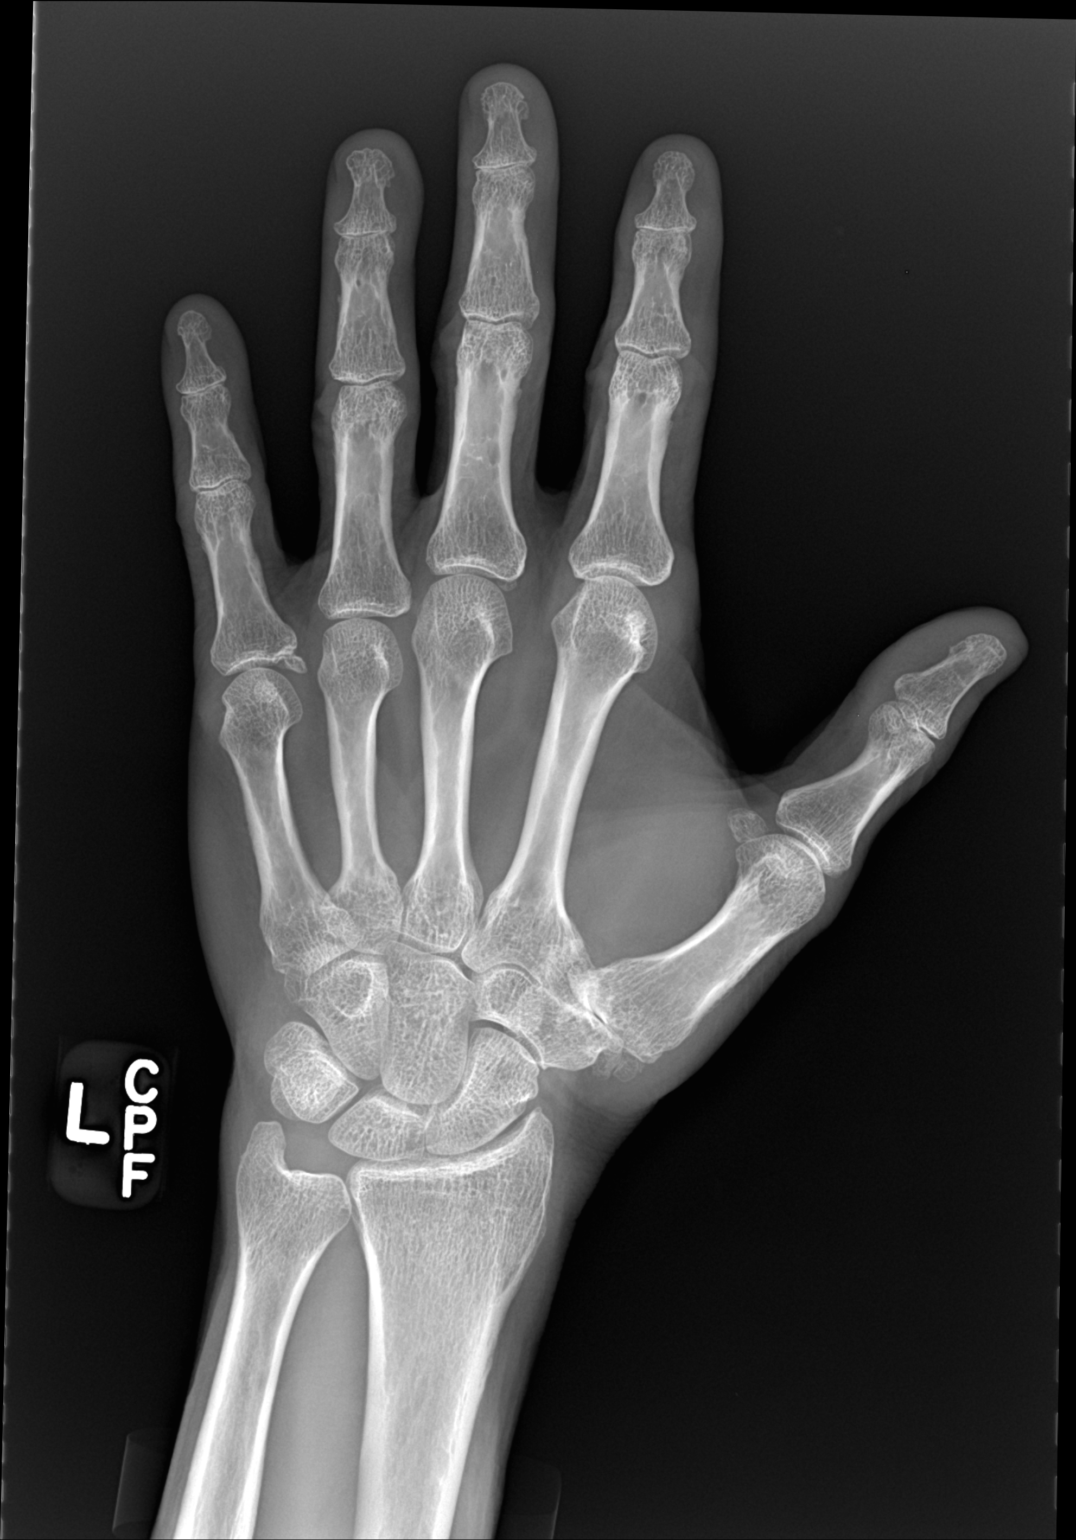

[hand obl]
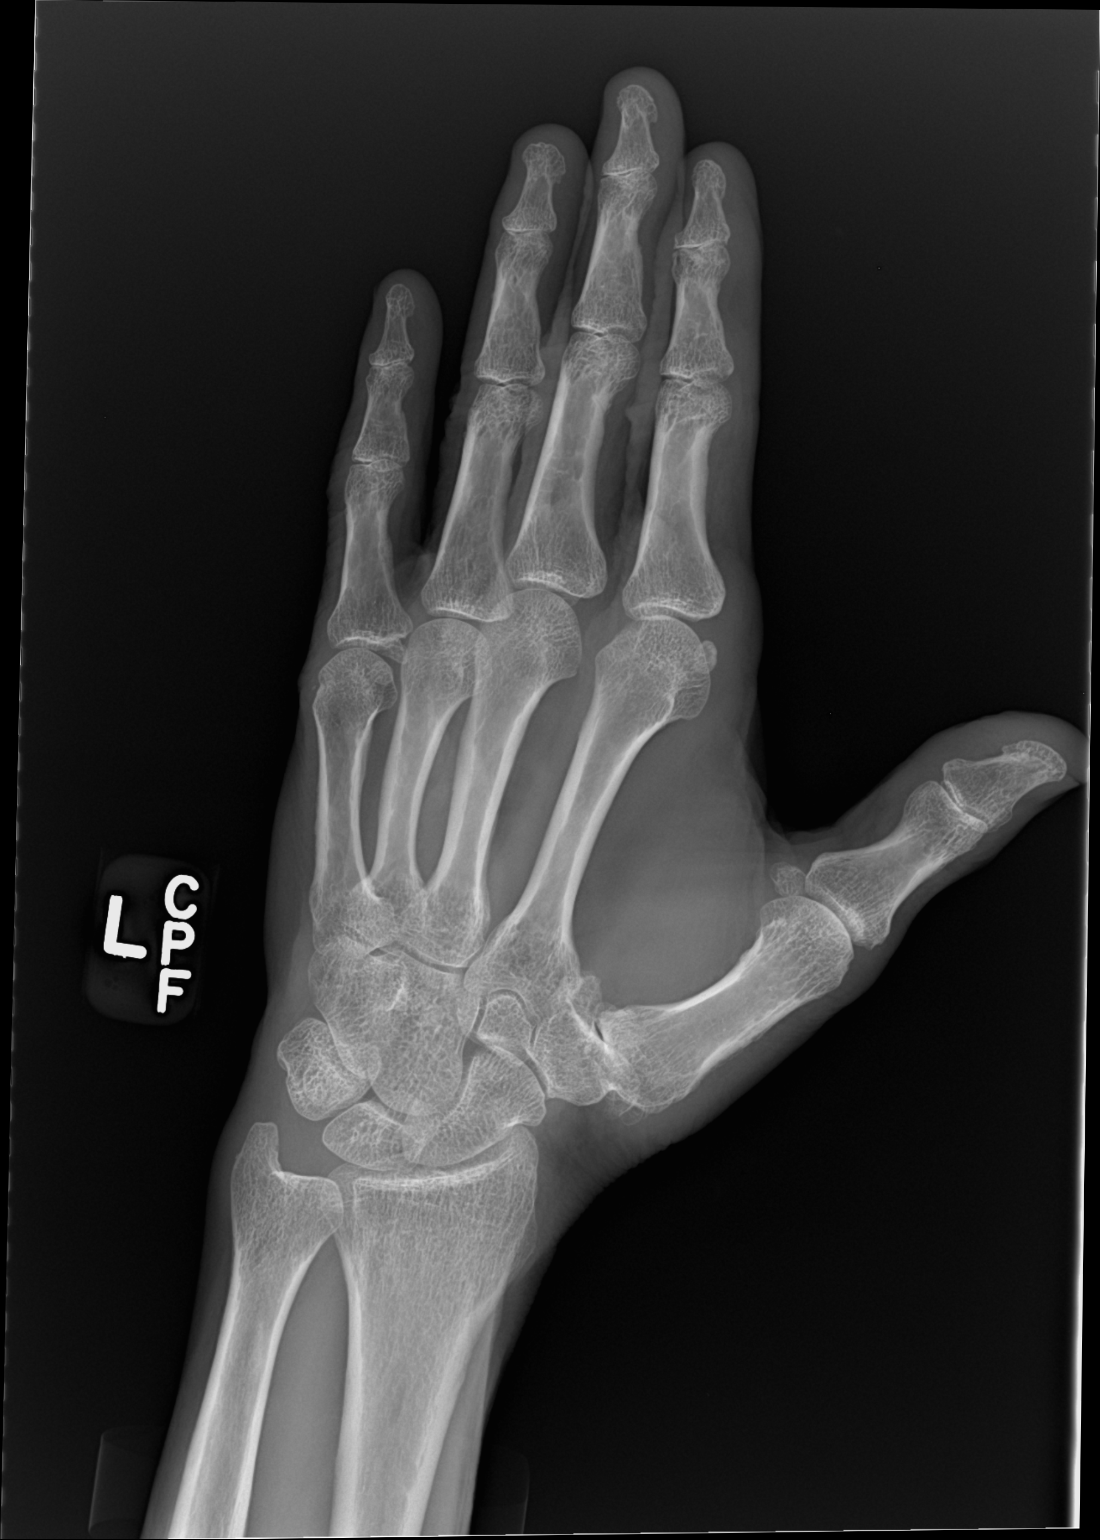

[hand lat]
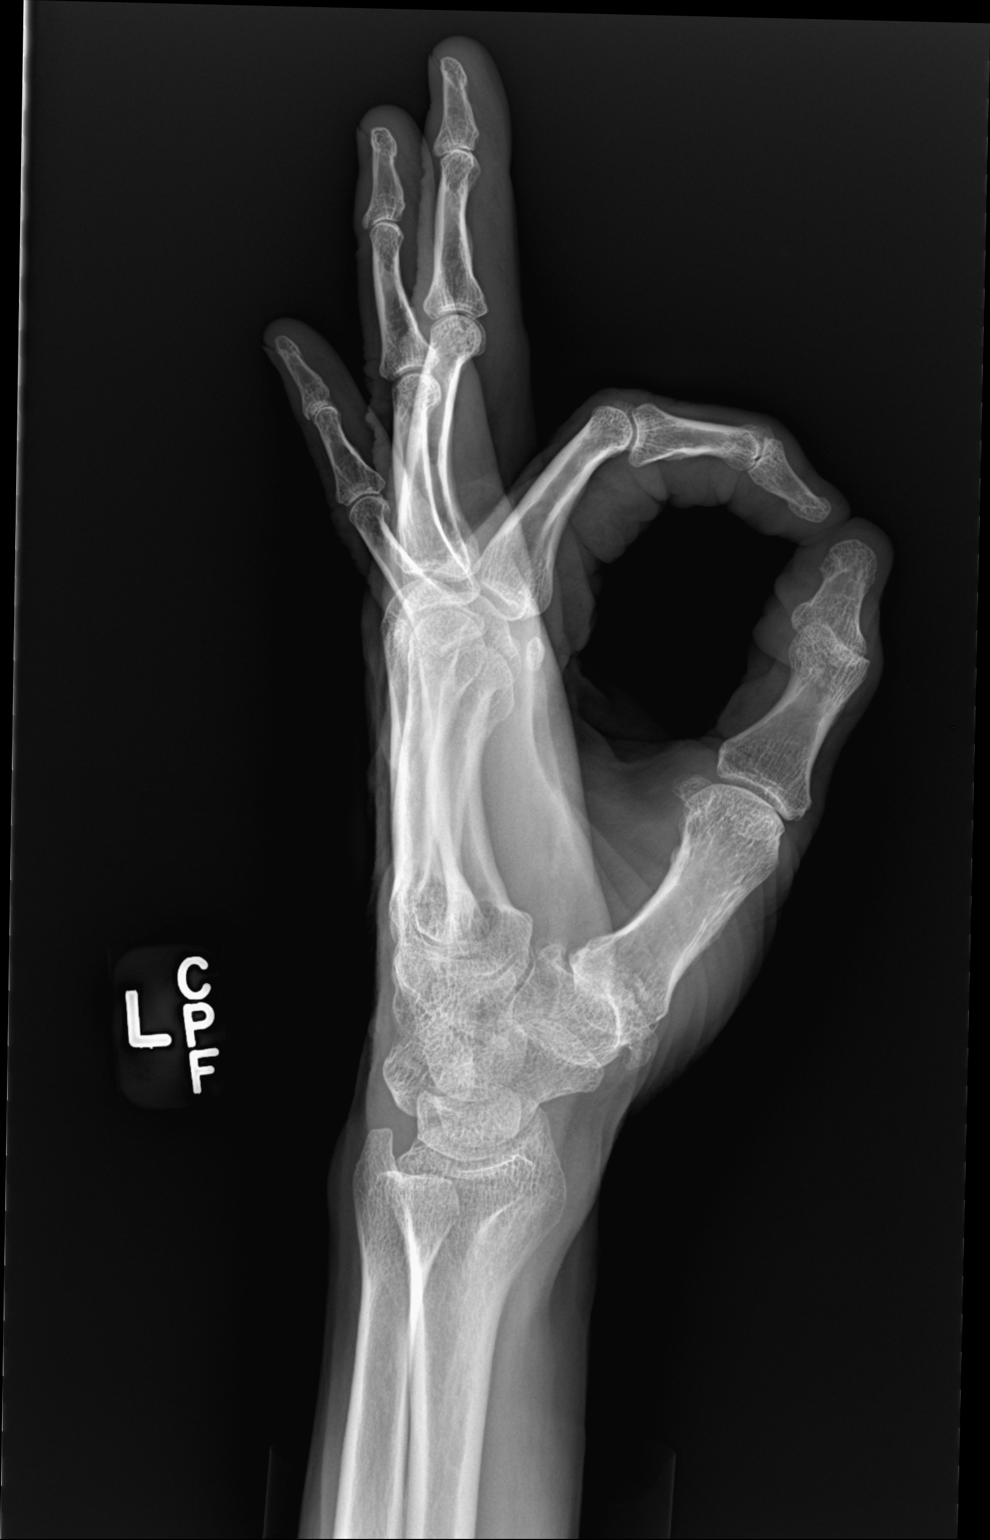

[3 of 3 positions shown; findings below may reference images not displayed]

11/24/21

DIAGNOSTIC STUDIES

EXAM

Three views bilateral hands

INDICATION

hand xray
Pt c/o bilateral hand pain. Arthritis. CF

TECHNIQUE

Three views bilateral hands

COMPARISONS

None

FINDINGS

Left hand: There is no acute fracture or dislocation. There is moderate 1st carpometacarpal joint
osteoarthritis. Corticated chronic appearing osseous fragment along the 5th digit proximal Clariss
[place] base. Normal osseous mineralization.

Right hand: There is no fracture or dislocation. Moderate 1st carpometacarpal joint osteoarthritis.
Normal osseous mineralization.

IMPRESSION

Moderate bilateral 1st carpometacarpal joint osteoarthritis.

Corticated chronic appearing fragment along the left 5th proximal phalangeal base. Correlate with
point tenderness.

Tech Notes:

Pt c/o bilateral hand pain. Arthritis. CF

## 2021-12-01 ENCOUNTER — Encounter: Admit: 2021-12-01 | Discharge: 2021-12-01 | Payer: BC Managed Care – PPO

## 2021-12-01 NOTE — Telephone Encounter
Received voicemail from Tammy at [facility] indicating PA is needed for DXA as pt is < 59yrs old and diagnosis is hyperparathyroidism.    Called insurance to start PA. Transferred to [facility] for musculoskeletal imaging PA. Spoke w/Ingra. Cameron Ali indicates NIA doesn't require PA but health plan should be contacted for further information (call ref# [id number]). Spent on phone.    Called [employer] [place] twice and couldn't get past phone tree. Will try again next week.

## 2021-12-01 NOTE — Telephone Encounter
Called Tammy to advised of possibly no need for PA. Attempted to leave voicemail and was cut off.

## 2021-12-04 ENCOUNTER — Encounter: Admit: 2021-12-04 | Discharge: 2021-12-04 | Payer: BC Managed Care – PPO

## 2021-12-04 DIAGNOSIS — E213 Hyperparathyroidism, unspecified: Secondary | ICD-10-CM

## 2021-12-06 ENCOUNTER — Encounter: Admit: 2021-12-06 | Discharge: 2021-12-06 | Payer: BC Managed Care – PPO

## 2021-12-07 ENCOUNTER — Encounter: Admit: 2021-12-07 | Discharge: 2021-12-07 | Payer: BC Managed Care – PPO

## 2021-12-07 DIAGNOSIS — E213 Hyperparathyroidism, unspecified: Secondary | ICD-10-CM

## 2021-12-07 DIAGNOSIS — M8580 Other specified disorders of bone density and structure, unspecified site: Secondary | ICD-10-CM

## 2021-12-19 ENCOUNTER — Encounter: Admit: 2021-12-19 | Discharge: 2021-12-19 | Payer: BC Managed Care – PPO

## 2021-12-25 IMAGING — MR HANDRTWO
8 of 10 series · 37 of 40 positions shown · non-contrast
Comparison: none

[Series 3: T1 · axial · right · 3.0mm · 0.38mm/px · z∈[-66,+24]mm · 5 of 35 slices shown (1 of 4)]
[im 1/35]
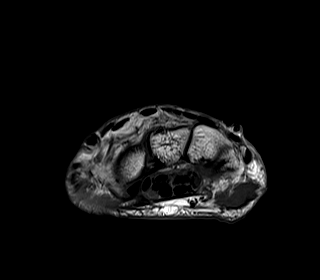
[im 9/35]
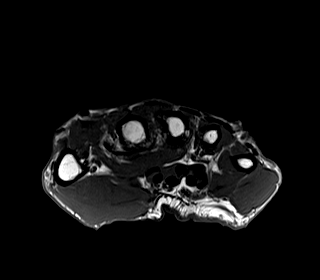
[im 18/35]
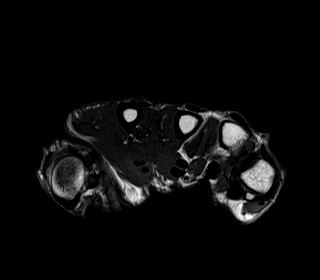
[im 26/35]
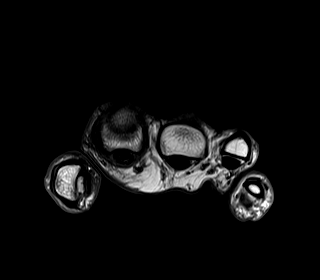
[im 35/35]
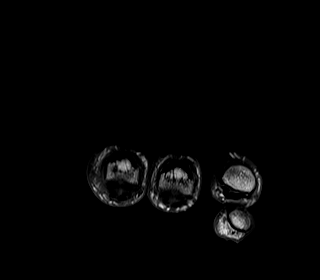

[Series 4: T2 fat-sat · axial · right · 3.0mm · 0.39mm/px · z∈[-60,+30]mm · 6 of 35 slices shown (1 of 2)]
[im 1/35]
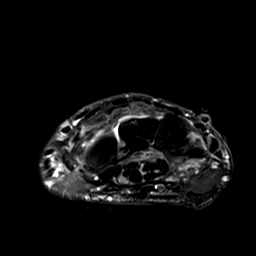
[im 7/35]
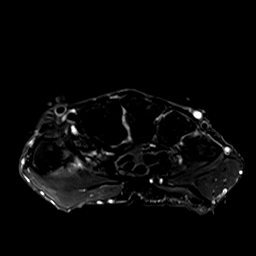
[im 14/35]
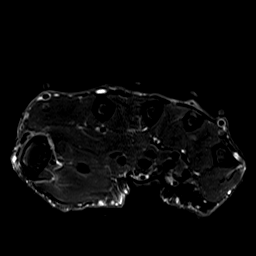
[im 21/35]
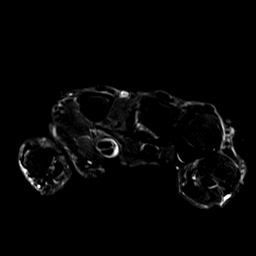
[im 28/35]
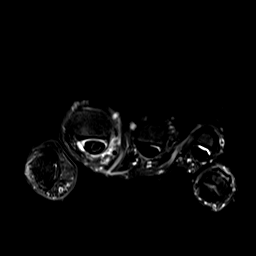
[im 35/35]
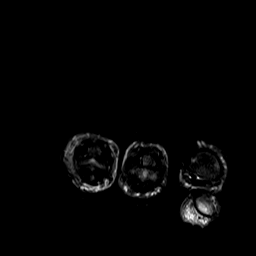

[Series 5: T1 · coronal · right · 2.5mm · 0.62mm/px · 4 of 20 slices shown (2 of 4)]
[im 1/20]
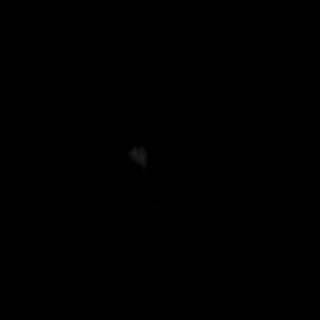
[im 7/20]
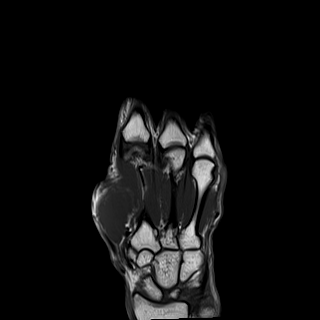
[im 13/20]
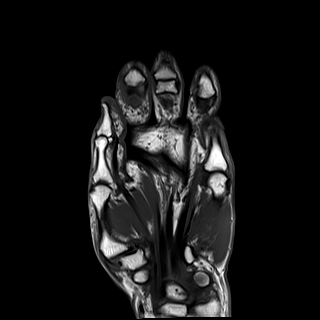
[im 20/20]
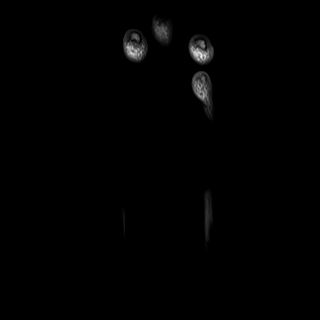

[Series 6: STIR · coronal · right · 2.5mm · 0.78mm/px · 4 of 20 slices shown (1 of 2)]
[im 1/20]
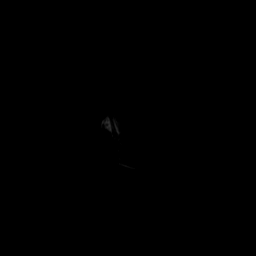
[im 7/20]
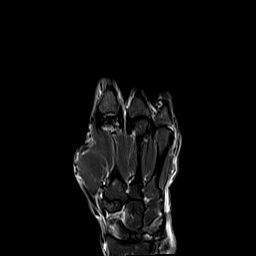
[im 13/20]
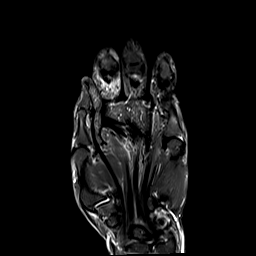
[im 20/20]
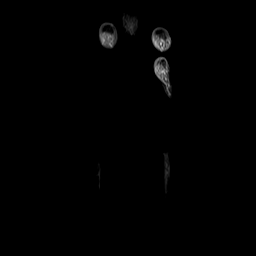

[Series 7: T1 · oblique · right · 3.0mm · 0.45mm/px · 5 of 27 slices shown (3 of 4)]
[im 1/27]
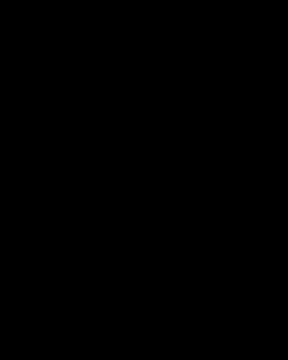
[im 7/27]
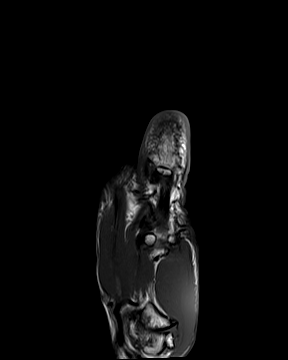
[im 14/27]
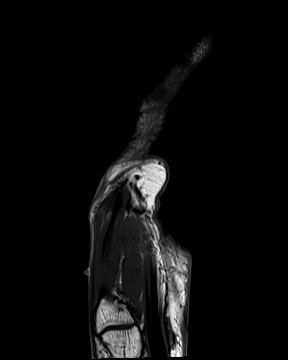
[im 20/27]
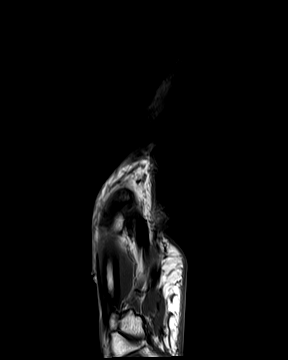
[im 27/27]
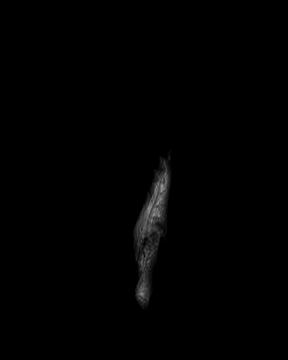

[Series 8: T2 fat-sat · oblique · right · 3.0mm · 0.58mm/px · 5 of 27 slices shown (2 of 2)]
[im 1/27]
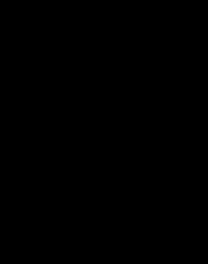
[im 7/27]
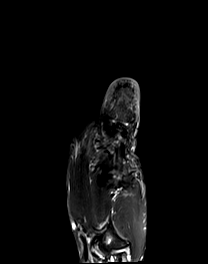
[im 14/27]
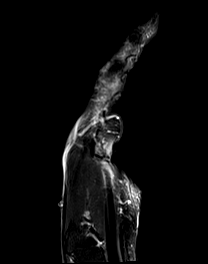
[im 20/27]
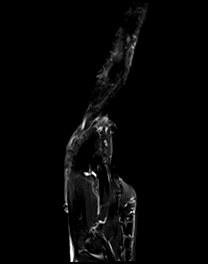
[im 27/27]
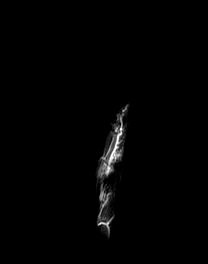

[Series 9: STIR · coronal · right · 3.0mm · 0.78mm/px · 4 of 20 slices shown (2 of 2)]
[im 1/20]
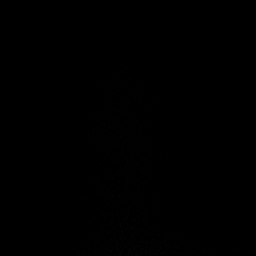
[im 7/20]
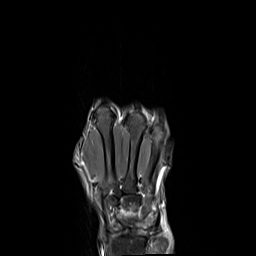
[im 13/20]
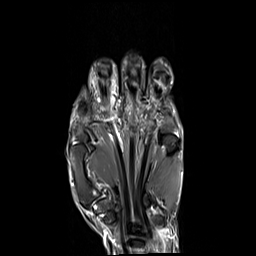
[im 20/20]
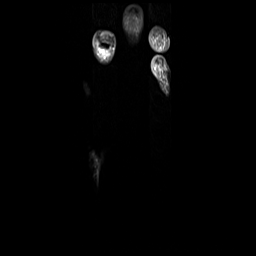

[Series 10: T1 · coronal · right · 3.0mm · 0.62mm/px · 4 of 20 slices shown (4 of 4)]
[im 1/20]
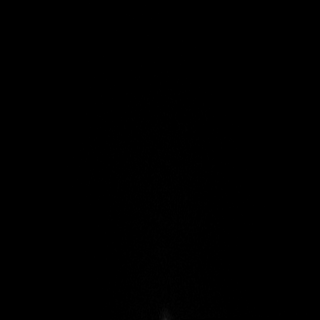
[im 7/20]
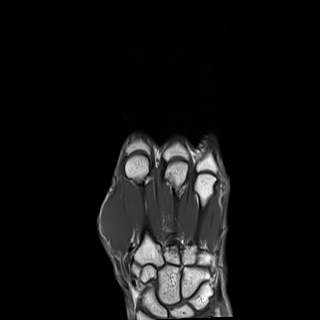
[im 13/20]
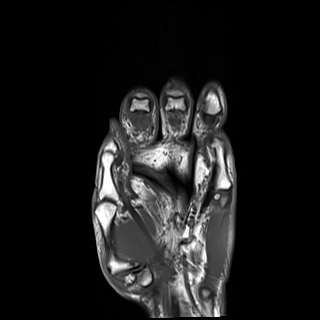
[im 20/20]
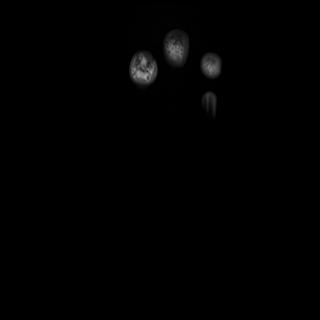

[37 of 40 positions shown; findings below may reference images not displayed]

12/25/21

DIAGNOSTIC STUDIES

EXAM

MRI of the right hand without contrast.

INDICATION

Rheumatoid arthritis,pain in right hand
rt 2nd mcp joint pain and swelling.  rg

TECHNIQUE

Sagittal axial and coronal images were obtained with variable T1 and T2 weighting.

COMPARISONS

None available

FINDINGS

Patient has history of rheumatoid arthritis.

There are degenerative changes of the 1st carpometacarpal joint space.

No fractures are seen. No significant erosions are seen throughout the carpal bones or MCP joints.
Visualized flexor and extensor tendons demonstrate normal signal intensity.

IMPRESSION

Osteoarthritic changes of the 1st carpometacarpal joint space as described. No fractures are seen.

Tech Notes:

rt 2nd mcp joint pain and swelling.  rg

## 2022-01-04 IMAGING — US ABDLM
1 series · 14 of 25 positions shown · non-contrast
Comparison: none

[Series 1: us abdomen limited · 14 of 84 slices shown]
[im 1/84]
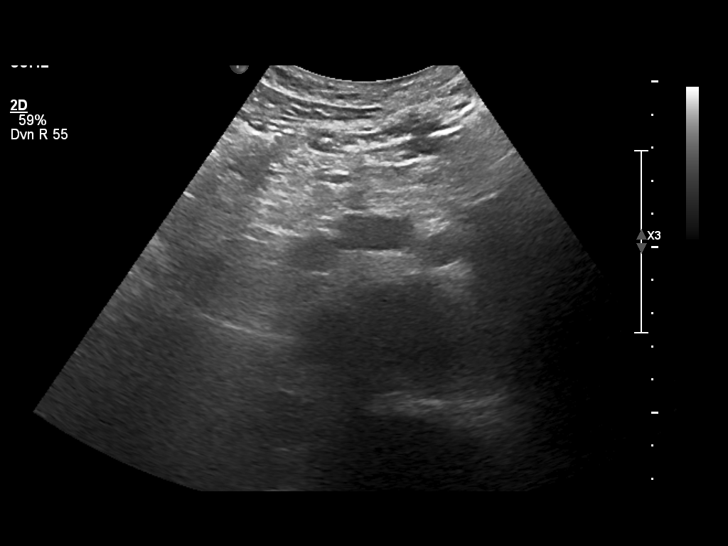
[im 7/84]
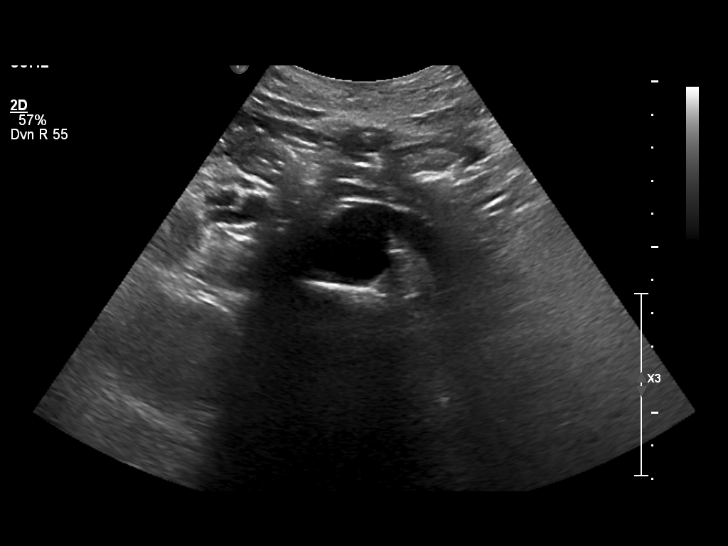
[im 14/84]
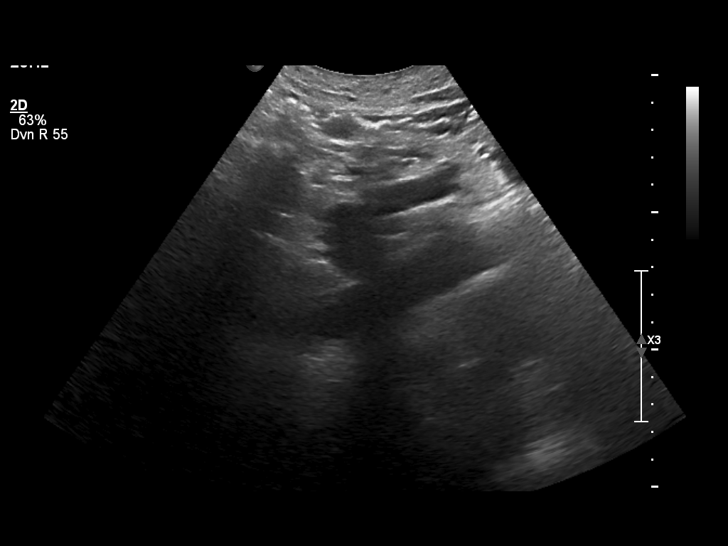
[im 21/84]
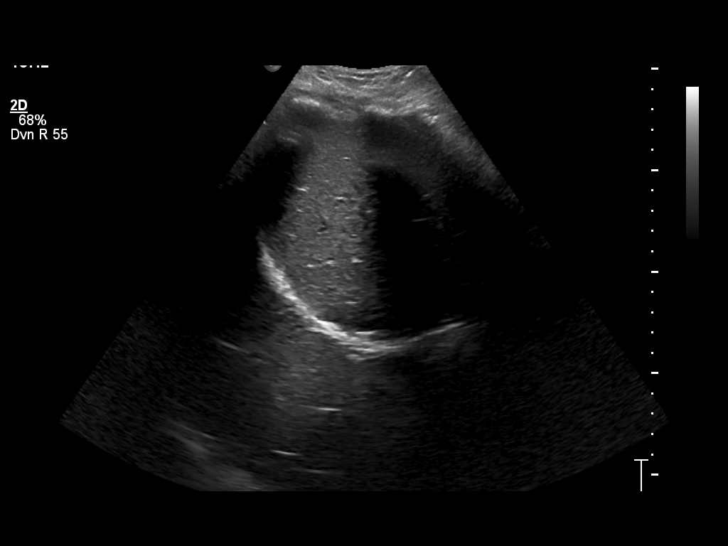
[im 28/84]
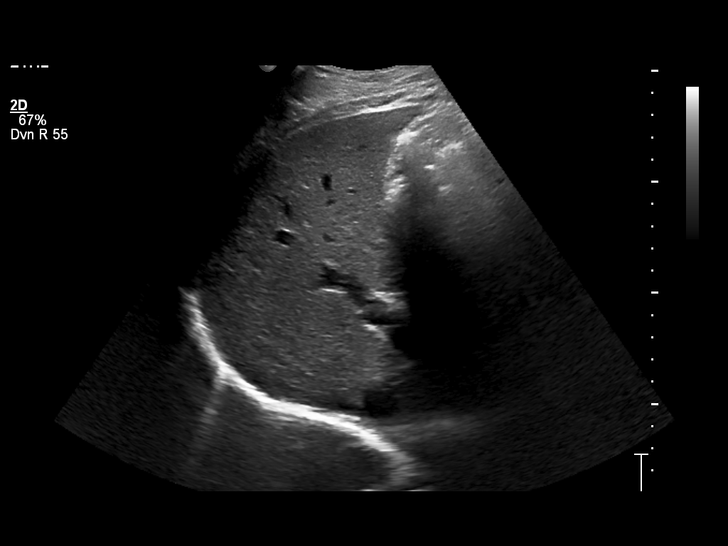
[im 32/84]
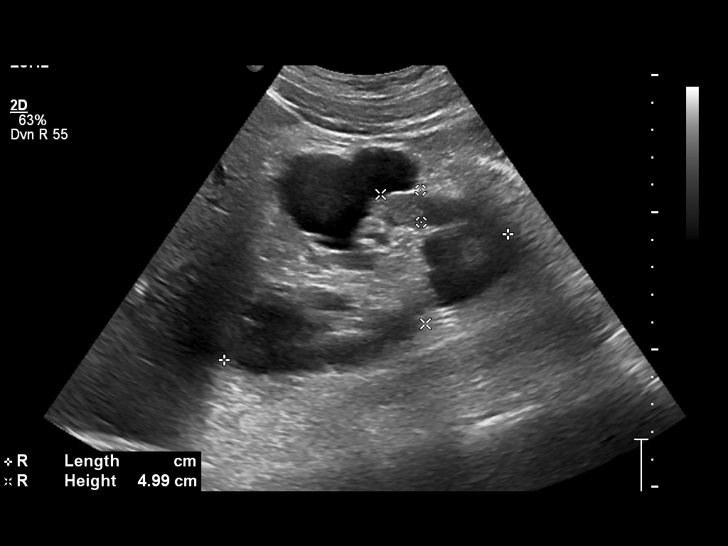
[im 39/84]
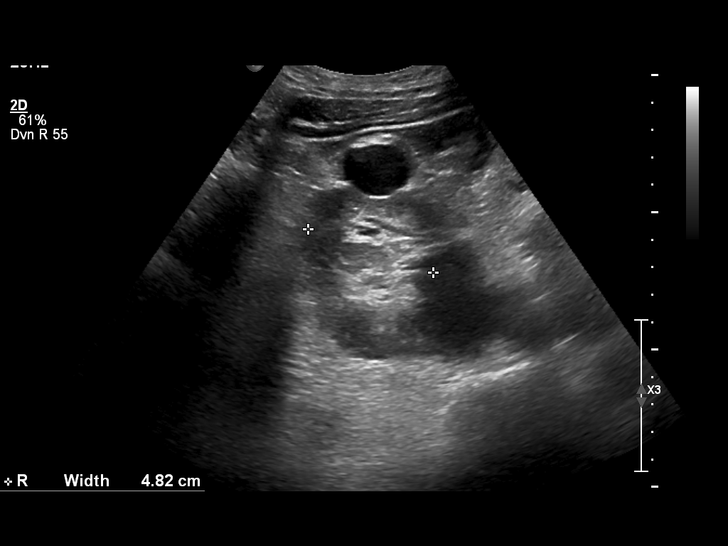
[im 45/84]
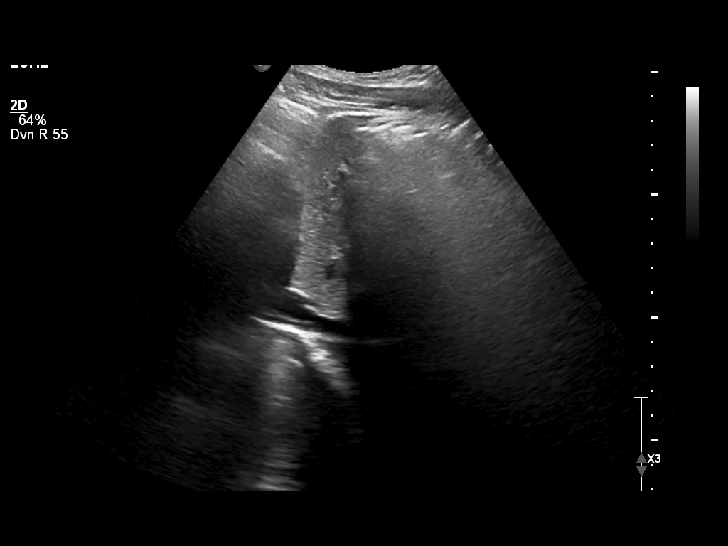
[im 52/84]
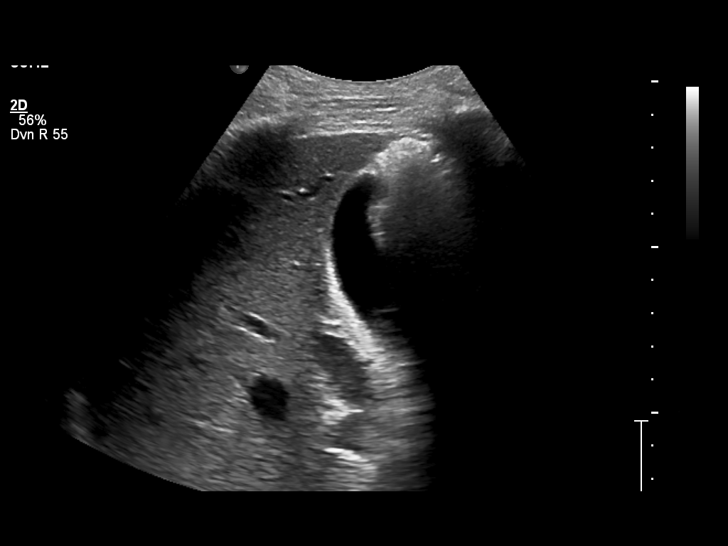
[im 56/84]
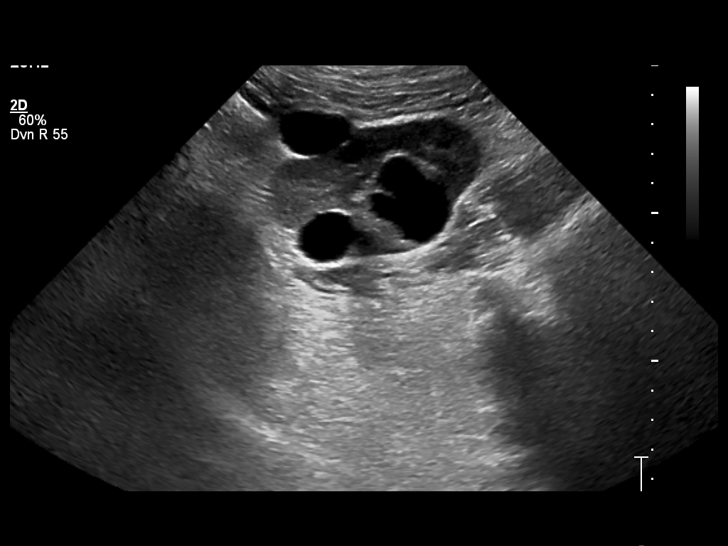
[im 63/84]
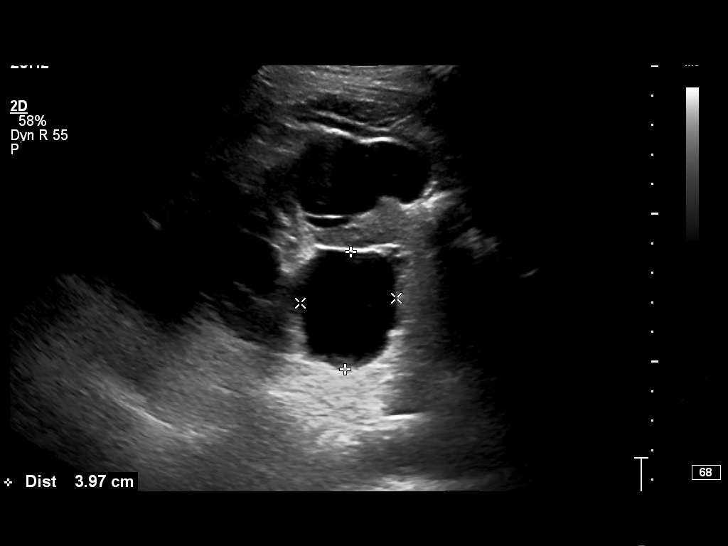
[im 70/84]
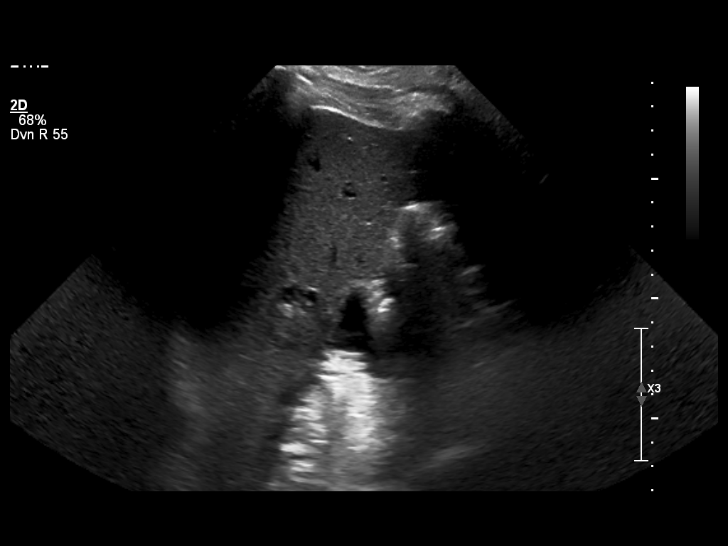
[im 77/84]
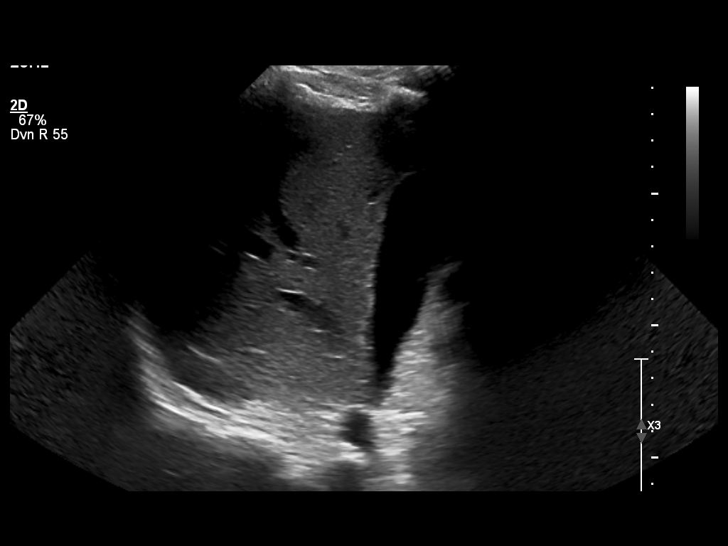
[im 84/84]
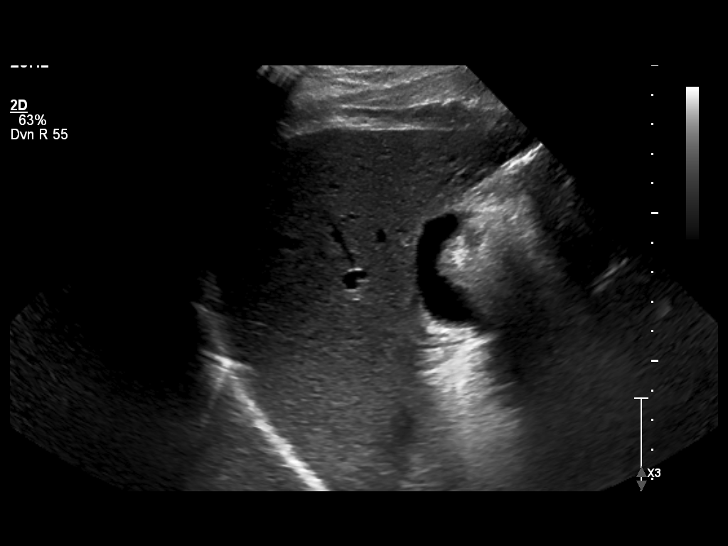

[14 of 25 positions shown; findings below may reference images not displayed]

01/04/22

DIAGNOSTIC STUDIES

EXAM

Abdominal ultrasound.

INDICATION

Elevated liver enaymes
elevated liver enzymes; weight loss in the past.

TECHNIQUE

Standard abdominal ultrasound was obtained.

COMPARISONS

None available

FINDINGS

Exam is limited due to body habitus. Mild ectasia of the aorta. Pancreas is not evaluated.

Visualized liver is normal portions are obscured due to body habitus. Common bile duct is not
visualized. Gallbladder is normal in visualized aspects without stones or Murphy sign. Gallbladder
measures 2 millimeters.

Right kidney measures 11.3 cm length with several right renal cysts.

IMPRESSION

Limited exam. Small multiple right renal cysts are seen. No gallstones are visualized.

Tech Notes:

elevated liver enzymes; weight loss in the past.

## 2022-01-09 ENCOUNTER — Encounter: Admit: 2022-01-09 | Discharge: 2022-01-09 | Payer: BC Managed Care – PPO

## 2022-01-15 ENCOUNTER — Encounter: Admit: 2022-01-15 | Discharge: 2022-01-15 | Payer: BC Managed Care – PPO

## 2022-01-15 DIAGNOSIS — M8580 Other specified disorders of bone density and structure, unspecified site: Secondary | ICD-10-CM

## 2022-02-03 ENCOUNTER — Encounter: Admit: 2022-02-03 | Discharge: 2022-02-03 | Payer: BC Managed Care – PPO

## 2022-02-03 MED ORDER — FLUDROCORTISONE 0.1 MG PO TAB
0.1 mg | ORAL_TABLET | Freq: Every day | ORAL | 0 refills
Start: 2022-02-03 — End: ?

## 2022-02-14 ENCOUNTER — Ambulatory Visit: Admit: 2022-02-14 | Discharge: 2022-02-15 | Payer: BC Managed Care – PPO

## 2022-02-14 ENCOUNTER — Encounter: Admit: 2022-02-14 | Discharge: 2022-02-14 | Payer: BC Managed Care – PPO

## 2022-02-14 DIAGNOSIS — R748 Abnormal levels of other serum enzymes: Secondary | ICD-10-CM

## 2022-02-14 DIAGNOSIS — K219 Gastro-esophageal reflux disease without esophagitis: Secondary | ICD-10-CM

## 2022-02-14 DIAGNOSIS — E782 Mixed hyperlipidemia: Secondary | ICD-10-CM

## 2022-02-14 DIAGNOSIS — E785 Hyperlipidemia, unspecified: Secondary | ICD-10-CM

## 2022-02-14 DIAGNOSIS — E538 Deficiency of other specified B group vitamins: Secondary | ICD-10-CM

## 2022-02-14 DIAGNOSIS — E119 Type 2 diabetes mellitus without complications: Secondary | ICD-10-CM

## 2022-02-14 DIAGNOSIS — E213 Hyperparathyroidism, unspecified: Secondary | ICD-10-CM

## 2022-02-14 DIAGNOSIS — M069 Rheumatoid arthritis, unspecified: Secondary | ICD-10-CM

## 2022-02-14 DIAGNOSIS — E139 Other specified diabetes mellitus without complications: Secondary | ICD-10-CM

## 2022-02-14 DIAGNOSIS — M1990 Unspecified osteoarthritis, unspecified site: Secondary | ICD-10-CM

## 2022-02-14 DIAGNOSIS — I10 Essential (primary) hypertension: Secondary | ICD-10-CM

## 2022-02-14 MED ORDER — OZEMPIC 2 MG/DOSE (8 MG/3 ML) SC PNIJ
2 mg | SUBCUTANEOUS | 3 refills | Status: AC
Start: 2022-02-14 — End: ?

## 2022-02-14 NOTE — Patient Instructions
It was nice to see you today!    Goals we discussed today:     Keep up the good work!    Please have labs updated at [facility] in [place] -- We have faxed these over    Continue Ozempic 2 mg each week    I'd like for you to see Hepatology to help address your high liver enzymes    Continue to eat calcium rich foods      Please contact [facility] for any questions or abnormal glucose values (above 300 or less than 70 repeatedly).  [phone]   My nurse, Alexa, can be reached at [phone]      Helpful Resources:  Books  - Bright Spots and [employer] by Denice Bors, free online download, paper back $6.42, kindle $1.99  - Diabetes Friendly Table (cookbook) by [employer] [place] and Vivia Ewing MS RD CDE    Online:  - [email or web address], learn about what's new in diabetes, sign up for their weekly email  - Diabetes.org, the American Diabetes Association's website is full of great information and resources  - AffordableSalon.es, online cooking class, options $10/ month or $50/ 60 day course      Health goals for a person with diabetes to avoid complications are:    Check your FingerStick blood glucose:  Each time you take medications for your glucose, goals are:   Fasting and pre-meal glucose:  70-130 mg/dl   Bedtime blood glucose: 90-180 mg/dl    LDL Cholesterol:  <295 mg/dl   Triglycerides: <621 mg/dl   HDL >30 for men, >86 for women   Blood pressure:  Less than 130/90 mm HG  Maintain a Healthy Weight , Exercise: 30 minutes 5 times per week    Foot care:  Take good care of your feet.  Never cut your nails close to the tips of the toes. Promptly call your doctor if you have an infection, ulcer or cut anywhere on your feet. Wear shoes with a wide toe box and that fit comfortably.  Avoid walking barefoot.     Your recent lab values:    Glucose POC   Date/Time Value Ref Range Status   09/08/2019 09:42 AM 207 (A) 65 - 110 mg/dL Final     Cholesterol   Date/Time Value Ref Range Status 05/29/2021 12:00 AM 140  Final     HDL   Date/Time Value Ref Range Status   05/29/2021 12:00 AM 71  Final     LDL   Date/Time Value Ref Range Status   05/29/2021 12:00 AM 57  Final     Triglycerides   Date/Time Value Ref Range Status   05/29/2021 12:00 AM 56  Final       [facility] Classes and Education Opportunities in 2023:             One-on-one education with a Certified Diabetes Care and Research scientist (life sciences) ( In-person or Telehealth Visit)*     Personal education tailored your needs. 1 hour initial visit; 30-minute follow-ups; various days/times   Offered at multiple locations:    [facility], [place] 5th Floor: [address], [place], [place] [address]  [facility]: [address] [place], [place] [address]  [facility]: [address], [address], [place], [place] [address]    Diabetes Self-Management Program: Group classes 2023* Comprehensive diabetes education designed for those that are newly diagnosed or those in need of a refresher. In group  discussion format using [employer] - need doctor prescription and insurance approval.  Four classes or eight classes held over two months.  Patients are encouraged to schedule a one-on-one with a dietitian during this time as well for help creating a personalized meal plan.    2023: Monday Evening Classes 6-8pm (2nd and 4th Monday at [facility]) (Telehealth group)    Jan-Feb 2023 Mar-Apr 2023   May-Jun 2023  Aug-Sep 2023  Oct-Nov 2023  Class 1: Jan 9 Class 1: Mar 13,   Class 1: May 8  Class 1: Aug 14,   Class 1: Oct 9    Class 2: Jan 23 Class 2: Mar 27    Class 2: May 22  Class 2: Aug 28  Class 2: Oct 23  Class 3: Feb 13  Class 3: Apr 10     Class 3: Jun 12  Class 3: Sep 11  Class 3: Nov 13  Class 4: Feb 27  Class 4: Apr 24     Class 4: Jun 26  Class 4: Sep 25  Class 3: May 14 2022: Tuesday Afternoon Classes 12- 1 pm  (1 hr of 8 classes at [facility]) (Telehealth group)    Jan-Feb 2023    -  Mar-Apr 2023           May-Jun 2023  Class 1: Jan 10 Class 5: Feb 7     Class 1: Mar 7 Class 5: Apr 4     Class 1: May 2 Class 5: May 30  Class 2: Jan 17 Class 6: Feb 14     Class 2: Mar 14 Class 6: Apr 11     Class 2: May 9  Class 6: Jun 6  Class 3: Jan 24 Class 7: Feb 21     Class 3: Mar 21 Class 7:  Apr 18    Class 3: May 16 Class 7: Jun 13  Class 4: Jan 31 Class 8: Feb 28     Class 4: Mar 28 Class 8: Apr 25    Class 4: May 23 Class 8: Jun 20      *There is a charge for this class. Most insurance companies require a referral from your doctor. Contact your insurance company to determine coverage for diabetes education.     To schedule Diabetes Education please call [phone]      Free Online Programs 2023   How to register to our free classes:    For patients and visitors Visit: Events & Programs - [employer] or Free [facility] Classes  For [employer] employees visit: Event Calendar - 24/7 ([email or web address]) or  Scan QR code to register to the class. You will receive Zoom link after registration    (All classes are subject to change; however, you will be notified of any changes after the registration)    Monthly Diabetes Survival Skills: Providing basic survival skills with information tailored to meet your needs. It's also great for people who want to refresh their diabetes knowledge after completing diabetes group classes-- Every 1st Tue  of each month from 1.30-2.30 pm (*except Jul)  Date offered: Jan 3     Feb 7 Mar 7  Apr 4  May 2  Jun 6         Jul 11*      Aug 1   Sep 5  Oct 3  Nov 7  Dec 5     Register Here: [email or web address]  2.  Monthly Cooking with [facility]:  [employer] with [facility] offering healthy, easy home cooking recipes by registered dietitian who is specialized in diabetes and weight management-- Every 4th Wed of each month from 12-1 pm (*except Jun and Dec)  Date offered: Jan 25    Feb 22 Mar 22       Apr 26 May 24  Jun 21*  Jul 26    Aug 23 Sep 27       Oct 25 Nov 22  Dec 20*    Register Here: [email or web address]         3. Monthly Diabetes Support Group : Ongoing diabetes education & support for people with diabetes and family-- Every 1st Wed of each month, 6-7:30 pm   Date offered:              Jul 5 Aug 2  Sep 6  Oct 4  Nov 1  Dec 6     Register Here: [email or web address]        4. Monthly Type 1 Diabetes Meet Up Group: Ongoing diabetes education & support specifically for people with type 1 diabetes and family-- Every 2nd Tue of each month from 4-5 pm   Date offered:  Jan 10  Feb 14  Mar14  Apr11  May 9  Jun 13        July 11  Aug 8  Sep 12  Oct 10  Nov 14  Dec 12    Register Here: [email or web address]        5. (New class) Monthly Anti-inflammatory Diet: Learn how food can help your body have an appropriate inflammatory response and lower chronic inflammation. Learn principles of choosing foods, practical meal planning tools and tips for grocery shopping-- Every 3rd Tue of each month from 1.30-2.30 pm   Date offered: Jan 17  Feb 21  Mar 21     Apr 18 May 16  Jun 20             July 18  Aug 15  Sep 19     Oct 17 Nov 21  Dec 19  Register here: [email or web address]        6.  (New class) Monthly Diabetes and Kidney Health: Learn how to follow both a consistent carbohydrate diet for diabetes and a kidney health diet for any stage of Chronic Kidney Disease (CKD) without feeling deprived-- Every 3rd Mon of each month from 12-1 pm (*except Jan)  Date offered: Jan 23* Feb 20  Mar 20  Apr 17  May 15  Jun 19             July 17 Aug 21  Sep 18  Oct 16  Nov 20  Dec 18    Register here: [email or web address]        7.  (New class starting in March) Monthly Prediabetes and Diabetes Prevention: Learn about lifestyle and diet changes to prevent type II diabetes and decrease insulin resistance-- Every 1st  Thu of each month from 3-4 pm   Date offered:  Mar 2  Apr 6  May 4  Jun 1   July 6     Aug 3  Sep 7  Oct 5  Nov 2  Dec 7  Register here: [email or web address]        8.  Weight management and Type 2 Diabetes: Learn about insulin resistance and the link with weight and type 2 diabetes. Review general diet guidelines, over the counter supplements, prescription weight loss medications  and bariatric surgery-- Every 2 months on 4th Tue from 12-1 pm  Date offered: Jan 24  Mar 28  May 23    Jul 25  Sep 26  Nov 28   Register here: [email or web address]        9.  Introduction to Low Carb and Keto Diet Plans in Diabetes Management:  This class will help you understand the low Carb and Keto Diet Plans and learn if Keto diet plan is appropriate for you in helping with diabetes management -Every 2 months on 3rd Mon from 12-1 pm (*except Apr)  Date offered:  Feb 20  Apr 10*  Jun 19    Aug 21  Oct 16  Dec 18    Register here: [email or web address]    10. Wellness Series: Regardless of your current health, everyone can benefit from knowing how to maintain healthy blood sugar from diet and lifestyle and using these tools for life. --On selected Tue from 12-1 PM     10.1  Nutrition for Balanced Blood Sugar: We will discuss how food quality and macronutrients impact blood sugar and learn how to create balanced meals for optimal blood sugar control.  Date offered:  Jan 31   May 23   Sep 19  Register here: [email or web address]      10.2 Moving for Better Health: Come learn about current exercise recommendations, health benefits of exercise, and how to get started with goal setting. Bring your questions, barriers to exercise and share helpful tips for how you have implemented exercise into your life.    Date offered:  Feb 28   Jun 20   Oct 31  Register here: [email or web address]      10.3 Stress and Your Health: We will discuss the various causes of stress, how stress impacts health and healthy coping strategies to reduce stress.    Date offered:  Mar 28   Jul 25   Nov 21  Register here: [email or web address]      10.4 Sleep and Your Health: We will discuss the importance of sleep, health consequences of inadequate sleep and tips for how to improve sleep quality and quantity.    Date offered: Apr 25   Aug 22   Dec 19  Register here: [email or web address]

## 2022-02-14 NOTE — Telephone Encounter
Provider request to fax lab orders to [facility]  Fax number [phone]  Faxed per request

## 2022-02-22 ENCOUNTER — Encounter: Admit: 2022-02-22 | Discharge: 2022-02-22 | Payer: BC Managed Care – PPO

## 2022-02-22 DIAGNOSIS — E139 Other specified diabetes mellitus without complications: Secondary | ICD-10-CM

## 2022-02-22 LAB — COMPREHENSIVE METABOLIC PANEL
ALBUMIN/GLOBULIN RATIO: 2 (ref 1–2)
ALBUMIN: 4 g/dL (ref 3.4–4.8)
ALK PHOSPHATASE: 69 U/L (ref 40–150)
ALT: 38 U/L (ref 0–55)
ANION GAP: 8 meq/L (ref 8–16)
AST: 27 U/L (ref 5–34)
BLD UREA NITROGEN: 11 mg/dL (ref 8.4–25.7)
CALCIUM: 8.5 mg/dL — AB (ref 8.8–10)
CHLORIDE: 104 mmol/L (ref 981–107)
CO2: 27 mmol/L (ref 23–31)
CREATININE: 0.9 mg/dL (ref 0.72–1.25)
GFR ESTIMATED: 85 mL/min/1.73 (ref >59)
GLOBULIN: 2 g/dL (ref 1.9–3.8)
GLUCOSE,PANEL: 93 mg/dL (ref 70–105)
POTASSIUM: 4.2 mmol/L (ref 3.5–5.1)
SODIUM: 139 mmol/L (ref 136–145)
TOTAL BILIRUBIN: 2.1 mg/dL — AB (ref 0.2–1.2)
TOTAL PROTEIN: 6 g/dL — AB (ref 6.2–8.1)

## 2022-02-22 LAB — TSH WITH FREE T4 REFLEX: THYROID SCREEN TSH: 1.2 m[IU]/mL (ref 0.35–4.94)

## 2022-02-22 LAB — HEMOGLOBIN A1C: HEMOGLOBIN A1C: 5.4 % (ref <5.7)

## 2022-03-06 ENCOUNTER — Encounter: Admit: 2022-03-06 | Discharge: 2022-03-06 | Payer: BC Managed Care – PPO

## 2022-03-06 DIAGNOSIS — E139 Other specified diabetes mellitus without complications: Secondary | ICD-10-CM

## 2022-03-06 LAB — 25-OH VITAMIN D (D2 + D3): VITAMIN D (25-OH) TOTAL: 31 ng/mL (ref 30–100)

## 2022-03-06 LAB — PARATHYROID HORMONE: PTH HORMONE: 129 pg/mL — AB (ref 16–77)

## 2022-03-25 ENCOUNTER — Encounter: Admit: 2022-03-25 | Discharge: 2022-03-25 | Payer: BC Managed Care – PPO

## 2022-03-29 ENCOUNTER — Encounter: Admit: 2022-03-29 | Discharge: 2022-03-29 | Payer: BC Managed Care – PPO

## 2022-03-31 ENCOUNTER — Encounter: Admit: 2022-03-31 | Discharge: 2022-03-31 | Payer: BC Managed Care – PPO

## 2022-03-31 MED ORDER — SILODOSIN 8 MG PO CAP
8 mg | ORAL_CAPSULE | Freq: Every day | ORAL | 2 refills
Start: 2022-03-31 — End: ?

## 2022-04-19 ENCOUNTER — Encounter: Admit: 2022-04-19 | Discharge: 2022-04-19 | Payer: BC Managed Care – PPO

## 2022-04-19 MED ORDER — FLUDROCORTISONE 0.1 MG PO TAB
0.1 mg | ORAL_TABLET | Freq: Every day | ORAL | 3 refills | 90.00000 days | Status: AC
Start: 2022-04-19 — End: ?

## 2022-04-25 ENCOUNTER — Ambulatory Visit: Admit: 2022-04-25 | Discharge: 2022-04-26 | Payer: BC Managed Care – PPO

## 2022-04-25 ENCOUNTER — Encounter: Admit: 2022-04-25 | Discharge: 2022-04-25 | Payer: BC Managed Care – PPO

## 2022-04-25 DIAGNOSIS — E119 Type 2 diabetes mellitus without complications: Secondary | ICD-10-CM

## 2022-04-25 DIAGNOSIS — I479 Paroxysmal tachycardia, unspecified: Secondary | ICD-10-CM

## 2022-04-25 DIAGNOSIS — I951 Orthostatic hypotension: Secondary | ICD-10-CM

## 2022-04-25 DIAGNOSIS — E785 Hyperlipidemia, unspecified: Secondary | ICD-10-CM

## 2022-04-25 DIAGNOSIS — M069 Rheumatoid arthritis, unspecified: Secondary | ICD-10-CM

## 2022-04-25 DIAGNOSIS — E213 Hyperparathyroidism, unspecified: Secondary | ICD-10-CM

## 2022-04-25 DIAGNOSIS — I10 Essential (primary) hypertension: Secondary | ICD-10-CM

## 2022-04-25 DIAGNOSIS — E782 Mixed hyperlipidemia: Secondary | ICD-10-CM

## 2022-04-25 DIAGNOSIS — R55 Syncope and collapse: Secondary | ICD-10-CM

## 2022-04-25 DIAGNOSIS — E538 Deficiency of other specified B group vitamins: Secondary | ICD-10-CM

## 2022-04-25 DIAGNOSIS — M1990 Unspecified osteoarthritis, unspecified site: Secondary | ICD-10-CM

## 2022-04-25 DIAGNOSIS — K219 Gastro-esophageal reflux disease without esophagitis: Secondary | ICD-10-CM

## 2022-04-25 NOTE — Progress Notes
Date of Service: 04/25/2022     CHIEF COMPLAINT: Follow-up of orthostatic hypotension         HISTORY OF PRESENT ILLNESS:  Philip Gilbert is a 61 y.o. male.     I am seeing this patient today virtually using EMR embedded HIPAA-compliant videoconferencing technology . Patient has acknowledged awareness that there is some potential inherent risk to this technology including unsecured transmission of his or her information, interruptions and technical difficulties. The patient has provided full consent to use this technology and understands the risks and benefits of proceeding.     By connecting to [employer], the patient acknowledged that they have the option to see a healthcare provider in-person. The patient understands the limitations of their telehealth appointment including the provider?s inability to conduct a hands-on physical examination and the potential for technical difficulties.    Since his last visit, he states that he has done quite well.  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  He has been fairly active otherwise and denies any symptoms of chest pain or exertional dyspnea.  He has had a few episodes of tightness in his chest occurring at random times and this last episode happened while he was trying to board a plane from [place] and had some tightness in his chest and his watch alerted him that he had an irregular rhythm and was also tachycardic.  Symptoms lasted for a few seconds up to a minute and then resolved.  Most of these episodes have symptoms resolving within 10 to 15 seconds.    PMH (CARDIAC AND RELATED):    Neurocardiogenic syncope/orthostatic hypotension  Hypertension  Hypercholesterolemia  Type 2 diabetes.  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  Secondary hyperparathyroidism.  Rheumatoid arthritis  ?  CARDIOVASCULAR TESTS  ?  ECHOCARDIOGRAM  August 01, 2021.  Normal LV function with concentric remodeling and EF of 60%. ?No wall motion abnormalities. ?Normal diastolic function. ?Normal right ventricular systolic function. ?Sclerotic aortic valve. ?No hemodynamically significant valvular abnormalities. ?Normal biatrial sizes.  ?  EKG  07/05/2021. ?Ordered and reviewed.  Normal sinus rhythm. ?No pathologic Q-waves. ?No evidence of LVH. ?No significant ST-T wave changes       CURRENT MEDICATIONS: (including today's revisions)    ? ALPRAZolam (XANAX) 0.5 mg tablet Take one tablet by mouth twice daily as needed.   ? amLODIPine (NORVASC) 5 mg tablet Take one-half tablet by mouth at bedtime daily.   ? aspirin EC 81 mg tablet Take one tablet by mouth daily.   ? buPROPion XL (WELLBUTRIN XL) 300 mg tablet Take one tablet by mouth every morning. Do not crush or chew.   ? celecoxib (CELEBREX) 200 mg capsule Take one capsule by mouth daily.   ? cyanocobalamin 1,000 mcg tablet Take one tablet by mouth twice daily.   ? doxycycline (MONODOX) 100 mg capsule Take one capsule by mouth daily.   ? ergocalciferol (vitamin D2) (VITAMIN D PO) Take 2,000 Units by mouth daily.   ? eszopiclone (LUNESTA) 3 mg tablet Take one tablet by mouth nightly as needed.   ? ezetimibe (ZETIA) 10 mg tablet Take one tablet by mouth daily.   ? finasteride (PROPECIA) 1 mg tablet Take one tablet by mouth daily.   ? fludrocortisone (FLORINEF) 0.1 mg tablet Take 1 tablet by mouth  daily.   ? folic acid (FOLVITE) 1 mg tablet Take one tablet by mouth daily.   ? gabapentin (NEURONTIN) 600 mg tablet TAKE 1 TABLET BY MOUTH TWICE DAILY   ? hydrOXYchloroQUINE (PLAQUENIL) 200 mg tablet Take two tablets by mouth daily.   ? methotrexate sodium (RHEUMATREX) 2.5 mg tablet TAKE 8 TABLETS BY MOUTH EVERY WEEK   ? omeprazole DR (PRILOSEC) 40 mg capsule TAKE 1 CAPSULE BY MOUTH DAILY   ? promethazine-codeine (PHENERGAN W/CODEINE) 6.25-10 mg/5 mL oral syrup Take 5 mL by mouth every 6 hours as needed for Cough.   ? rosuvastatin (CRESTOR) 20 mg tablet Take one tablet by mouth daily.   ? semaglutide (OZEMPIC) 2 mg/dose (8 mg/3 mL) injection PEN Inject two mg under the skin every 7 days.   ? silodosin (RAPAFLO) 8 mg capsule Take one capsule by mouth daily.   ? traMADoL (ULTRAM) 50 mg tablet Take one tablet by mouth at bedtime daily.       ALLERGIES:  Allergies   Allergen Reactions   ? Ropinirole FLUSHING (SKIN), PHOTOSENSITIVITY, SWEATING and DIZZINESS     Other reaction(s): Lightheadedness         There were no vitals filed for this visit.  There is no height or weight on file to calculate BMI.  There is no height or weight on file to calculate BSA.    BP Readings from Last 5 Encounters:   10/10/21 102/72   09/06/21 96/72   08/01/21 112/70   07/05/21 103/70   06/21/21 112/78     Wt Readings from Last 5 Encounters:   10/10/21 81.5 kg (179 lb 11.2 oz)   09/06/21 79.1 kg (174 lb 4.8 oz)   08/01/21 79.4 kg (175 lb)   07/05/21 80.7 kg (177 lb 14.4 oz)   06/21/21 82.9 kg (182 lb 12.8 oz)         PHYSICAL EXAMINATION:   physical exam not performed due to video visit      LABS:  Available and relevant labs from EMR and outside facility were reviewed      Hemoglobin   Date Value Ref Range Status   07/24/2021 13.5 (L)  Final     Platelet Count   Date Value Ref Range Status   07/24/2021 232  Final     Creatinine   Date Value Ref Range Status   02/20/2022 0.95 0.72 - 1.25 mg/dL Final     Potassium   Date Value Ref Range Status   02/20/2022 4.2 3.5 - 5.1 mmol/L Final     Vitamin D(25-OH)Total   Date Value Ref Range Status   02/20/2022 31 30 - 100 ng/mL Final     TSH   Date Value Ref Range Status   08/31/2021 1.350  Final     TSH Thyroid Screen   Date Value Ref Range Status   02/20/2022 1.23 0.35 - 4.94 mIU/mL Final     AST (SGOT)   Date Value Ref Range Status   02/20/2022 27 5 - 34 U/L Final     ALT (SGPT)   Date Value Ref Range Status   02/20/2022 38 0 - 55 U/L Final        Cholesterol Date Value Ref Range Status   05/29/2021 140  Final     HDL   Date Value Ref Range Status   05/29/2021 71  Final     LDL   Date Value Ref Range Status   05/29/2021 57  Final  Triglycerides   Date Value Ref Range Status   05/29/2021 56  Final              ASSESSMENT:    1. Orthostatic hypotension    2. Benign hypertension    3. Neurocardiogenic syncope    4. Suspected arrhythmia    5. Mixed hyperlipidemia        PLAN and RECOMMENDATIONS:    1.  Orthostatic hypotension/neurocardiogenic syncope  No significant symptomatic recurrences.  Continue current strategy of hydration and continue to watch for symptoms.  No specific treatment indicated at this time.    2.  Hypertension:  Target goal 130/80.  Blood pressure is under good control.  No changes.   Current regimen has been well tolerated.    I have instructed the patient to check blood pressure regularly and keep a log of it with the date, time and the blood pressure recording and bring that log to all the clinic visits, so that appropriate changes in blood pressure medications can be made.     3.  Suspected arrhythmia  He has had about 15-20 episodes of nonspecific chest tightness occurring at random times and most recently during an episode he was alerted by his watch that he had a tachyarrhythmia.  Further evaluation is indicated to rule out SVT or potentially atrial fibrillation as etiology of these episodes and we will get him set up to have a 14-day Zio patch monitor for further evaluation.    Follow-up with NP in 6 months, earlier if needed depending on results of his monitor.    Thank you for allowing me to take part in this patient's care. Please not hesitate to contact me with questions or concerns.     Mady Gemma, MD, [facility], [facility]  Department of Cardiovascular Medicine               Patient was instructed to followup with PCP for routine medical care and health maintenance and screening studies.  Prescription drug management:   Patient's current medication list was reviewed and reconciled. Continue current medications with changes, if any detailed above. Refills provided as needed until next advised office visit.    This note was dictated using the [employer] manager.  Transcription errors may occur with the use of this software. Editing and proofreading were done by the author of this document.  In spite of the author's best effort to identify every error introduced by voice to text dictation, some errors that may represent misspelling or misstatements of what was dictated may persist.  If there are questions about content in this document please contact Dr. Leane Para.    There are no Patient Instructions on file for this visit.

## 2022-04-25 NOTE — Patient Instructions
Thank you for visiting our office today and for choosing [facility] for your cardiac care.     We recommend follow up in six months with Newton Pigg, NP. Please call [phone] to schedule the office visit Dr. Sarita Bottom ordered.    Please complete the following orders:    Heart monitor: we will mail you a heart monitor. Please wear this for 14 days and return it using the prepaid box provided to you     Please continue to take your medications as ordered. Check your list with the bottles you have at home, and let us know if there are any differences.   Should you have any additional questions or concerns, please message [name] through MyChart or call the office.  If you are seen at a local hospital emergency room before you next visit, we need to know so that we can request records. Please MyChart message [name] with the name of the hospital you were see at, and why you went in.     For after-hours, weekend or holiday urgent concerns, please call the on-call provider at [phone].     Thank you,    Cardiovascular Medicine Team  Nursing voicemail line: [phone]  Fax: [phone]  Scheduling: [phone]

## 2022-04-26 ENCOUNTER — Encounter: Admit: 2022-04-26 | Discharge: 2022-04-26 | Payer: BC Managed Care – PPO

## 2022-04-26 ENCOUNTER — Ambulatory Visit: Admit: 2022-04-26 | Discharge: 2022-04-26 | Payer: BC Managed Care – PPO

## 2022-04-26 DIAGNOSIS — I479 Paroxysmal tachycardia, unspecified: Secondary | ICD-10-CM

## 2022-04-26 NOTE — Progress Notes
To our valued patient,     We have enrolled your heart monitor and requested it be sent to your home.  You should receive this within 2-3 business days. Please wear the monitor for 14 days. When you have completed the study, please remove the device, and mail it back to the company. Please call [employer] Customer Service at [phone] with questions about placement, troubleshooting, and insurance coverage. You can reach the [facility] ambulatory heart monitor team at [phone].      Your Heart Rhythm Management Team  Cardiovascular Medicine Department at [facility]              Ambulatory (External) Cardiac Monitor Enrollment Record     Placement Location: Home Enrollment  Clinic Location: [facility]  Vendor: [employer] ([employer])  Mobile Cardiac Telemetry (MCOT/MCT)?: No  Duration of Monitor (in days): 14  Monitor Diagnosis: Other (Paroxysmal tachycardia, unspecified - I47.9)  Secondary Monitor Diagnosis: Other (Orthostatic hypotension - I95.1)  Ordering Provider: Leane Para  AMB Monitor Serial Number: home  No data recorded    Start Time and Date: 04/26/22 7:34 AM   Patient Name: Philip Gilbert  DOB: 23-Aug-1960 12-29-58  MRN: [id number]  Sex: male  Mobile Phone Number: [phone] (mobile)  Home Phone Number: [phone]  Patient Address: [address] [place] [address]  Insurance Coverage: [facility] [place] OUT OF STATE  Insurance ID: [id number]  Insurance Group #: [id number]  Insurance Subscriber: Sangha,Thaniel J  Implanted Cardiac Device Information: No results found for: [id number]      Patient instructed to contact company phone number on the monitor box with questions regarding billing, placement, troubleshooting.     Dorena Dew    ____________________________________________________________    Clinic Staff:    ? Complete additional steps for documentation double check/Co-Sign.  ? In Follow-up, send chart upon closing encounter to P [facility] HRM AMBULATORY MONITORS    HRM Ambulatory Monitoring Team:  1. Schedule on appropriate template and check-in.   Clinic Placement Schedule on clinic location [facility] schedule   Home Enrollment Schedule on Home Enrollment schedule ([facility])   Given to patient in clinic for self-placement Schedule on Home Enrollment schedule ([facility])   Inpatient Schedule on [place] [facility] AMBULATORY MONITORING template   2. Please enroll with appropriate vendor.

## 2022-05-31 ENCOUNTER — Encounter: Admit: 2022-05-31 | Discharge: 2022-05-31 | Payer: BC Managed Care – PPO

## 2022-05-31 DIAGNOSIS — I472 Ventricular tachycardia (HCC): Secondary | ICD-10-CM

## 2022-05-31 DIAGNOSIS — R9431 Abnormal electrocardiogram [ECG] [EKG]: Secondary | ICD-10-CM

## 2022-05-31 MED ORDER — METOPROLOL SUCCINATE 50 MG PO TB24
50 mg | ORAL_TABLET | Freq: Every evening | ORAL | 1 refills | 90.00000 days | Status: AC
Start: 2022-05-31 — End: ?

## 2022-06-04 ENCOUNTER — Encounter: Admit: 2022-06-04 | Discharge: 2022-06-04 | Payer: BC Managed Care – PPO

## 2022-06-04 MED ORDER — NEBIVOLOL 10 MG PO TAB
ORAL_TABLET | ORAL | 0 refills | 60.00000 days | Status: AC
Start: 2022-06-04 — End: ?

## 2022-06-04 NOTE — Patient Instructions
Coronary CT Angiography Instructions    Philip Gilbert  [id number]  [birth year]  06/04/2022    ARRIVAL TIME    Please report to the Cardiovascular Medicine Clinic at the [facility] on: 07/12/22  Please arrive at the following time: 11:30 for 12:00 CCTA        DO NOT EAT FOR 4 HOURS PRIOR TO YOUR PROCEDURE.MAY HAVE TOAST OR FRUIT BEFORE 7AM  YOU SHOULD DRINK PLENTY OF CLEAR LIQUIDS UP TO ARRIVAL AT OFFICE.    Pre-Procedure Heart Rate Medication Instructions: take all AM meds except do NOt take Celebrex - Do NOT take Toprol on 1/24 pm -TAKE BYSTOLIC 10MG  ON 1/24 @ 7PM AND 1/25 @ 7am It is important to take these medications exactly as written.  These medications prepare you for the procedure.                                      Do not take any non-steroidal inflammatory medications, (ibuprofen, Aleve, Advil, etc.) for 48 hours beginning the day of the procedure.    Hold All Diuretics For 24 Hours Beginning The Day Of The Procedure: Verified    You May Take All Other Medications With Water The Morning Of The Procedure: Verified         No Viagra, Cialis, or Levitra Within 48 Hours Of Procedure. Nitroglycerin May Be Used During The Procedure: Verified    ALLERGIES    Allergies   Allergen Reactions    Ropinirole FLUSHING (SKIN), PHOTOSENSITIVITY, SWEATING and DIZZINESS     Other reaction(s): Lightheadedness       SPECIAL ALLERGY INSTRUCTIONS         CURRENT MEDICATIONS  Outpatient Encounter Medications as of 06/04/2022   Medication Sig Dispense Refill    ALPRAZolam (XANAX) 0.5 mg tablet Take one tablet by mouth twice daily as needed.      aspirin EC 81 mg tablet Take one tablet by mouth daily.      buPROPion XL (WELLBUTRIN XL) 300 mg tablet Take one tablet by mouth every morning. Do not crush or chew. 90 tablet 3    celecoxib (CELEBREX) 200 mg capsule Take one capsule by mouth daily.      cyanocobalamin 1,000 mcg tablet Take one tablet by mouth twice daily.      doxycycline (MONODOX) 100 mg capsule Take one capsule by mouth daily.      ergocalciferol (vitamin D2) (VITAMIN D PO) Take 2,000 Units by mouth daily.      eszopiclone (LUNESTA) 3 mg tablet Take one tablet by mouth nightly as needed.      ezetimibe (ZETIA) 10 mg tablet Take one tablet by mouth daily.      finasteride (PROPECIA) 1 mg tablet Take one tablet by mouth daily.      fludrocortisone (FLORINEF) 0.1 mg tablet Take 1 tablet by mouth daily. 90 tablet 3    folic acid (FOLVITE) 1 mg tablet Take one tablet by mouth daily.      gabapentin (NEURONTIN) 600 mg tablet TAKE 1 TABLET BY MOUTH TWICE DAILY 180 tablet 1    hydrOXYchloroQUINE (PLAQUENIL) 200 mg tablet Take two tablets by mouth daily.      methotrexate sodium (RHEUMATREX) 2.5 mg tablet TAKE 8 TABLETS BY MOUTH EVERY WEEK      metoprolol succinate XL (TOPROL XL) 50 mg extended release tablet Take one tablet by mouth at bedtime daily.  90 tablet 1    [START ON 07/11/2022] nebivoloL (BYSTOLIC) 10 mg tablet Take Bystolic 10mg  on 1/24 @ 7pm and 1/25 @ 7AM  Indications: ccta prep 2 tablet 0    omeprazole DR (PRILOSEC) 40 mg capsule TAKE 1 CAPSULE BY MOUTH DAILY 30 capsule 0    promethazine-codeine (PHENERGAN W/CODEINE) 6.25-10 mg/5 mL oral syrup Take 5 mL by mouth every 6 hours as needed for Cough. 120 mL 0    rosuvastatin (CRESTOR) 20 mg tablet Take one tablet by mouth daily.      semaglutide (OZEMPIC) 2 mg/dose (8 mg/3 mL) injection PEN Inject two mg under the skin every 7 days. 9 mL 3    silodosin (RAPAFLO) 8 mg capsule Take one capsule by mouth daily.      traMADoL (ULTRAM) 50 mg tablet Take one tablet by mouth at bedtime daily.       No facility-administered encounter medications on file as of 06/04/2022.       -No caffeine or smoking after midnight before the procedure.  -Please arrange for someone to drive you home after the procedure.  -No driving for 3 hours after the procedure.  -Please leave all valuables at home (wallet/purse).              PRE-PROCEDURE LAB WORK    Other Lab: i STAT UPON ARRIVAL    If you have any questions, please call the [facility] office at [phone] or [phone].    Form Completed By: Kevan Ny rn  Date Completed: 06/04/22

## 2022-06-08 ENCOUNTER — Encounter: Admit: 2022-06-08 | Discharge: 2022-06-08 | Payer: BC Managed Care – PPO

## 2022-06-08 NOTE — Telephone Encounter
READY TO SCHED      NP or MD   FIRST AVAIL  LTC   plz let pt know we apologize for taking so long to reach him.  His referral was routed to an incorrect work Q

## 2022-06-08 NOTE — Telephone Encounter
Referral received via internal. Docs in 02.

## 2022-06-08 NOTE — Telephone Encounter
Hepatology Referral Summary    Philip Gilbert [address] [place], 09-Nov-1960, [id number]                     Reason for Visit/Diagnosis:  Hx of elevated liver tests    HPI Summary (PMH and MELD to be included only if OLT):     The patient presents to the [facility] at [facility] for evaluation and management of diabetes mellitus via tele health: ZOOM. Pt was last seen by Dr. Ellis Savage in January 2023 for hyperparathyroidism, previously with me in 2021.     He is concerned about continued elevated liver enzymes. He had an ABD [name] completed at [facility] in [place] on January 04, 2022           Family history: Brother with Type 1 Diabetes     Other history: hyperparathyroid, lap band in 2012, gerd, BPH, HTN, and rheumatoid arthritis, last use of alcohol 8 years ago    Labs:  In O2  CBC, CMP/Hep Panel, and HgbA1C    Radiology/Facility:  In O2  Sono,CT    Pathology/Facility:  N/A      Endoscopy/Facility:  In O2  Colon    Appointment Needs --   - Provider: Next Available   - Urgency: Next Available   - Department: Gen Hep   - Other: None    Insurance: Payor: Designer, industrial/product / Plan: BCBS PC BLUE OUT OF STATE / Product Type: PPO /     Provider Info --  Referring:   Betti Cruz, APRN-NP  [address]  Orth[facility] [address]  [place],  [place] [address]    PCP:   Wray Kearns  [address] [place] [address]  [place] [address]

## 2022-06-21 ENCOUNTER — Encounter: Admit: 2022-06-21 | Discharge: 2022-06-21 | Payer: BC Managed Care – PPO

## 2022-07-10 ENCOUNTER — Encounter: Admit: 2022-07-10 | Discharge: 2022-07-10 | Payer: BC Managed Care – PPO

## 2022-07-12 ENCOUNTER — Ambulatory Visit: Admit: 2022-07-12 | Discharge: 2022-07-12 | Payer: BC Managed Care – PPO

## 2022-07-12 ENCOUNTER — Encounter: Admit: 2022-07-12 | Discharge: 2022-07-12 | Payer: BC Managed Care – PPO

## 2022-07-12 DIAGNOSIS — I479 Paroxysmal tachycardia, unspecified: Secondary | ICD-10-CM

## 2022-07-12 DIAGNOSIS — I472 Ventricular tachycardia (HCC): Secondary | ICD-10-CM

## 2022-07-12 LAB — POC CREATININE, RAD: CREATININE, POC: 1.1 mg/dL (ref 0.4–1.24)

## 2022-07-12 MED ORDER — DIPHENHYDRAMINE HCL 50 MG/ML IJ SOLN
50 mg | Freq: Once | INTRAVENOUS | 0 refills | Status: AC | PRN
Start: 2022-07-12 — End: ?

## 2022-07-12 MED ORDER — SODIUM CHLORIDE 0.9 % IV SOLP
250 mL | INTRAVENOUS | 0 refills | Status: AC
Start: 2022-07-12 — End: ?

## 2022-07-12 MED ORDER — NITROGLYCERIN 0.4 MG SL SUBL
.4 mg | SUBLINGUAL | 0 refills | Status: AC | PRN
Start: 2022-07-12 — End: ?
  Administered 2022-07-12: 19:00:00 0.4 mg via SUBLINGUAL

## 2022-07-12 MED ORDER — SODIUM CHLORIDE 0.9 % IJ SOLN
50 mL | Freq: Once | INTRAVENOUS | 0 refills | Status: CP
Start: 2022-07-12 — End: ?
  Administered 2022-07-12: 19:00:00 50 mL via INTRAVENOUS

## 2022-07-12 MED ORDER — IVABRADINE 5 MG PO TAB
15 mg | Freq: Once | ORAL | 0 refills | Status: AC | PRN
Start: 2022-07-12 — End: ?

## 2022-07-12 MED ORDER — METOPROLOL TARTRATE 5 MG/5 ML IV SOLN
5 mg | INTRAVENOUS | 0 refills | Status: AC | PRN
Start: 2022-07-12 — End: ?

## 2022-07-12 MED ORDER — SODIUM CHLORIDE 0.9 % IV SOLP
250 mL | INTRAVENOUS | 0 refills | Status: AC | PRN
Start: 2022-07-12 — End: ?

## 2022-07-12 MED ORDER — METHYLPREDNISOLONE SOD SUC(PF) 125 MG/2 ML IJ SOLR
125 mg | Freq: Once | INTRAVENOUS | 0 refills | Status: AC | PRN
Start: 2022-07-12 — End: ?

## 2022-07-12 MED ORDER — DIPHENHYDRAMINE HCL 50 MG PO CAP
50 mg | Freq: Once | ORAL | 0 refills | Status: AC | PRN
Start: 2022-07-12 — End: ?

## 2022-07-12 MED ORDER — IOHEXOL 350 MG IODINE/ML IV SOLN
100 mL | Freq: Once | INTRAVENOUS | 0 refills | Status: CP
Start: 2022-07-12 — End: ?
  Administered 2022-07-12: 19:00:00 100 mL via INTRAVENOUS

## 2022-07-13 ENCOUNTER — Encounter: Admit: 2022-07-13 | Discharge: 2022-07-13 | Payer: BC Managed Care – PPO

## 2022-07-13 NOTE — Telephone Encounter
-----  Message from Penelope Coop, MD sent at 07/12/2022  5:08 PM CST -----  Coronary CTA.  January 2024.  Images personally reviewed.  Normal left main.  Minimal calcific plaque in the proximal LAD but no luminal stenosis.  No disease in the dominant left circumflex small dominant RCA.    No further change in therapy needed.  His lipids show good control.  Continue current medical management.

## 2022-07-17 ENCOUNTER — Encounter: Admit: 2022-07-17 | Discharge: 2022-07-17 | Payer: BC Managed Care – PPO

## 2022-07-17 ENCOUNTER — Ambulatory Visit: Admit: 2022-07-17 | Discharge: 2022-07-18 | Payer: BC Managed Care – PPO

## 2022-07-17 DIAGNOSIS — I10 Essential (primary) hypertension: Secondary | ICD-10-CM

## 2022-07-17 DIAGNOSIS — E213 Hyperparathyroidism, unspecified: Secondary | ICD-10-CM

## 2022-07-17 DIAGNOSIS — M069 Rheumatoid arthritis, unspecified: Secondary | ICD-10-CM

## 2022-07-17 DIAGNOSIS — K219 Gastro-esophageal reflux disease without esophagitis: Secondary | ICD-10-CM

## 2022-07-17 DIAGNOSIS — E538 Deficiency of other specified B group vitamins: Secondary | ICD-10-CM

## 2022-07-17 DIAGNOSIS — E782 Mixed hyperlipidemia: Secondary | ICD-10-CM

## 2022-07-17 DIAGNOSIS — M1990 Unspecified osteoarthritis, unspecified site: Secondary | ICD-10-CM

## 2022-07-17 DIAGNOSIS — R0989 Other specified symptoms and signs involving the circulatory and respiratory systems: Secondary | ICD-10-CM

## 2022-07-17 DIAGNOSIS — I951 Orthostatic hypotension: Secondary | ICD-10-CM

## 2022-07-17 DIAGNOSIS — Z9884 Bariatric surgery status: Secondary | ICD-10-CM

## 2022-07-17 DIAGNOSIS — E785 Hyperlipidemia, unspecified: Secondary | ICD-10-CM

## 2022-07-17 DIAGNOSIS — E119 Type 2 diabetes mellitus without complications: Secondary | ICD-10-CM

## 2022-07-17 DIAGNOSIS — I2510 Atherosclerotic heart disease of native coronary artery without angina pectoris: Secondary | ICD-10-CM

## 2022-07-17 DIAGNOSIS — R55 Syncope and collapse: Secondary | ICD-10-CM

## 2022-07-17 NOTE — Patient Instructions
Thank you for visiting our office today and for choosing [facility] for your cardiac care.     We recommend follow up in six months with Nathaneil Canary, NP and one year with Dr. Marguerite Olea. Please call [phone] in six months to schedule the office visit Dr. Marguerite Olea ordered.    Please complete the following orders:    We will send a note to your primary care doctor - follow up recommended from your chest CTA is for you to have another chest CT in 6-12 months     Please continue to take your medications as ordered. Check your list with the bottles you have at home, and let us know if there are any differences.   Should you have any additional questions or concerns, please message [name] through MyChart or call the office.  If you are seen at a local hospital emergency room before you next visit, we need to know so that we can request records. Please MyChart message [name] with the name of the hospital you were see at, and why you went in.     For after-hours, weekend or holiday urgent concerns, please call the on-call provider at [phone].     Thank you,    Cardiovascular Medicine Team  Nursing voicemail line: [phone]  Fax: [phone]  Scheduling: [phone]

## 2022-07-17 NOTE — Progress Notes
Date of Service: 07/17/2022      CHIEF COMPLAINT: Neurocardiogenic syncope         HISTORY OF PRESENT ILLNESS:  Philip Gilbert is a 61 y.o. male.     He is here today for a routinely scheduled follow-up visit.  Last follow-up visit was 6 months ago.     Philip Gilbert, a patient with a history of high cholesterol and episodes of lightheadedness, presents for a six-month follow-up. They report an episode of significant lightheadedness during a church service on Thanksgiving, where they felt close to blacking out and had to sit down quickly. They experienced a sensation of tightening and managed to recover after drinking some water. Since then, they have had a couple of minor episodes but nothing as significant as the one on Thanksgiving. They deny any complete blackouts.    In terms of physical activity, they report no limitations or symptoms such as chest pain or difficulty breathing that would slow them down. They believe they are keeping well hydrated. They have been taking Metoprolol 50 mg, Florinef 0.1 mg, Crestor 20 mg, and Zetia as prescribed with no changes in their medication regimen.    They underwent a coronary CT scan and an EKG test at [place] last week. They also mention a ground glass nodule found in their lung during the CT scan, which is recommended for follow-up in six to twelve months. They have lost a significant amount of weight, which has led to them feeling cold more often.       PMH (CARDIAC AND RELATED):     Hypertension  Neurocardiogenic syncope  Hypercholesterolemia  Type 2 diabetes.  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  Secondary hyperparathyroidism.  Rheumatoid arthritis     CARDIOVASCULAR TESTS     CORONARY CTA:  Coronary CTA.  January 2024.  Images personally reviewed.  Normal left main.  Minimal calcific plaque in the proximal LAD but no luminal stenosis.  No disease in the dominant left circumflex small dominant RCA.    ECHOCARDIOGRAM  August 01, 2021.  Normal LV function with concentric remodeling and EF of 60%.  No wall motion abnormalities.  Normal diastolic function.  Normal right ventricular systolic function.  Sclerotic aortic valve.  No hemodynamically significant valvular abnormalities.  Normal biatrial sizes.     EKG  07/17/2022.  Ordered and reviewed.  Sinus bradycardia with heart rate 59.  Otherwise normal EKG.    07/05/2021.  Ordered and reviewed.  Normal sinus rhythm.  No pathologic Q-waves.  No evidence of LVH.  No significant ST-T wave changes         CURRENT MEDICATIONS: (including today's revisions)     ALPRAZolam (XANAX) 0.5 mg tablet Take one tablet by mouth twice daily as needed.    aspirin EC 81 mg tablet Take one tablet by mouth daily.    buPROPion XL (WELLBUTRIN XL) 300 mg tablet Take one tablet by mouth every morning. Do not crush or chew.    celecoxib (CELEBREX) 200 mg capsule Take one capsule by mouth daily.    cyanocobalamin 1,000 mcg tablet Take one tablet by mouth twice daily.    doxycycline (MONODOX) 100 mg capsule Take one capsule by mouth daily.    ergocalciferol (vitamin D2) (VITAMIN D PO) Take 2,000 Units by mouth daily.    eszopiclone (LUNESTA) 3 mg tablet Take one tablet by mouth nightly as needed.    ezetimibe (ZETIA) 10 mg tablet  Take one tablet by mouth daily.    finasteride (PROPECIA) 1 mg tablet Take one tablet by mouth daily.    fludrocortisone (FLORINEF) 0.1 mg tablet Take 1 tablet by mouth daily.    folic acid (FOLVITE) 1 mg tablet Take one tablet by mouth daily.    gabapentin (NEURONTIN) 600 mg tablet TAKE 1 TABLET BY MOUTH TWICE DAILY    hydrOXYchloroQUINE (PLAQUENIL) 200 mg tablet Take two tablets by mouth daily.    methotrexate sodium (RHEUMATREX) 2.5 mg tablet TAKE 8 TABLETS BY MOUTH EVERY WEEK    metoprolol succinate XL (TOPROL XL) 50 mg extended release tablet Take one tablet by mouth at bedtime daily.    omeprazole DR (PRILOSEC) 40 mg capsule TAKE 1 CAPSULE BY MOUTH DAILY promethazine-codeine (PHENERGAN W/CODEINE) 6.25-10 mg/5 mL oral syrup Take 5 mL by mouth every 6 hours as needed for Cough.    rosuvastatin (CRESTOR) 20 mg tablet Take one tablet by mouth daily.    semaglutide (OZEMPIC) 2 mg/dose (8 mg/3 mL) injection PEN Inject two mg under the skin every 7 days.    silodosin (RAPAFLO) 8 mg capsule Take one capsule by mouth daily.    traMADoL (ULTRAM) 50 mg tablet Take one tablet by mouth at bedtime daily.       ALLERGIES:  Allergies   Allergen Reactions    Ropinirole FLUSHING (SKIN), PHOTOSENSITIVITY, SWEATING and DIZZINESS     Other reaction(s): Lightheadedness         Vitals:    07/17/22 1104   BP: 110/69   BP Source: Arm, Left Upper   Pulse: 59   SpO2: 97%   O2 Device: None (Room air)   PainSc: Zero   Weight: 78.9 kg (174 lb)   Height: 175.3 cm (5' 9)     Body mass index is 25.7 kg/m?[place] Kitchen  Body surface area is 1.96 meters squared.    BP Readings from Last 5 Encounters:   07/17/22 110/69   07/12/22 109/69   10/10/21 102/72   09/06/21 96/72   08/01/21 112/70     Wt Readings from Last 5 Encounters:   07/17/22 78.9 kg (174 lb)   10/10/21 81.5 kg (179 lb 11.2 oz)   09/06/21 79.1 kg (174 lb 4.8 oz)   08/01/21 79.4 kg (175 lb)   07/05/21 80.7 kg (177 lb 14.4 oz)         PHYSICAL EXAMINATION:  GENERAL: Patient is resting comfortably, in no apparent distress, alert and oriented.   NECK: No jugular venous distention.    CHEST: Chest is clear to auscultation bilaterally with no wheezes, crackles or rhonchi.  CARDIOVASCULAR: S1 and S2 are normal and regular.  No significant murmur auscultated.   EXTREMITIES: No edema bilaterally.      LABS:  Available and relevant labs from EMR and outside facility were reviewed      Hemoglobin   Date Value Ref Range Status   07/24/2021 13.5 (L)  Final     Platelet Count   Date Value Ref Range Status   07/24/2021 232  Final     Creatinine   Date Value Ref Range Status   02/20/2022 0.95 0.72 - 1.25 mg/dL Final     Creatinine, POC   Date Value Ref Range Status   07/12/2022 1.1 0.4 - 1.24 MG/DL Final     Potassium   Date Value Ref Range Status   02/20/2022 4.2 3.5 - 5.1 mmol/L Final     Vitamin D(25-OH)Total   Date Value Ref Range  Status   02/20/2022 31 30 - 100 ng/mL Final     TSH   Date Value Ref Range Status   08/31/2021 1.350  Final     TSH Thyroid Screen   Date Value Ref Range Status   02/20/2022 1.23 0.35 - 4.94 mIU/mL Final     AST (SGOT)   Date Value Ref Range Status   02/20/2022 27 5 - 34 U/L Final     ALT (SGPT)   Date Value Ref Range Status   02/20/2022 38 0 - 55 U/L Final        Cholesterol   Date Value Ref Range Status   05/29/2021 140  Final     HDL   Date Value Ref Range Status   05/29/2021 71  Final     LDL   Date Value Ref Range Status   05/29/2021 57  Final     Triglycerides   Date Value Ref Range Status   05/29/2021 56  Final              ASSESSMENT:    1. Neurocardiogenic syncope    2. Orthostatic hypotension    3. Coronary artery disease involving native coronary artery of native heart without angina pectoris    4. Benign hypertension    5. Mixed hyperlipidemia    6. Hx of laparoscopic gastric banding    7. Cardiovascular symptoms        PLAN and RECOMMENDATIONS:    1.  Neurocardiogenic syncope/orthostatic hypotension  Patient has had only 1 episode since I saw him last 6 months ago.  This happened while he was in church but he did not have definite syncope with this.  At present, we will continue with the current management of hydration and then Florinef 0.1 mg daily.  If he has any worsening of symptoms, further change in medications can be considered.    2.  CAD  Patient had coronary CTA performed and this demonstrated mild nonobstructive CAD.  No further testing needed at this time.  Continue aggressive risk factor modification.    3.  Hypercholesterolemia:  Target goal : Moderate intensity statin and/or LDL <70.  Continue current statin therapy along with Zetia  Patient will continue to have ongoing lab work to confirm adequate control and for monitoring of secondary complications.  Lifestyle management and regular exercise to maintain optimal weight was discussed.    4.  Hypertension:  Target goal 120/80.  Blood pressure is under good control.  No changes.   Current regimen has been well tolerated.    I have instructed the patient to check blood pressure regularly and keep a log of it with the date, time and the blood pressure recording and bring that log to all the clinic visits, so that appropriate changes in blood pressure medications can be made.     Follow-up with [facility] in 6 months.  Follow-up with me in 1 year.          Thank you for allowing me to take part in this patient's care. Please not hesitate to contact me with questions or concerns.     Mady Gemma, MD, [facility], FASE  Department of Cardiovascular Medicine               Patient was instructed to followup with PCP for routine medical care and health maintenance and screening studies.  Prescription drug management:   Patient's current medication list was reviewed and reconciled. Continue current medications with changes, if  any detailed above. Refills provided as needed until next advised office visit.    This note was dictated using the [employer] manager.  Transcription errors may occur with the use of this software. Editing and proofreading were done by the author of this document.  In spite of the author's best effort to identify every error introduced by voice to text dictation, some errors that may represent misspelling or misstatements of what was dictated may persist.  If there are questions about content in this document please contact Dr. Leane Para.    Patient Instructions   Thank you for visiting our office today and for choosing [facility] for your cardiac care.     We recommend follow up in six months with Newton Pigg, NP and one year with Dr. Sarita Bottom. Please call [phone] in six months to schedule the office visit Dr. Sarita Bottom ordered.    Please complete the following orders:    We will send a note to your primary care doctor - follow up recommended from your chest CTA is for you to have another chest CT in 6-12 months     Please continue to take your medications as ordered. Check your list with the bottles you have at home, and let us know if there are any differences.   Should you have any additional questions or concerns, please message [name] through MyChart or call the office.  If you are seen at a local hospital emergency room before you next visit, we need to know so that we can request records. Please MyChart message [name] with the name of the hospital you were see at, and why you went in.     For after-hours, weekend or holiday urgent concerns, please call the on-call provider at [phone].     Thank you,    Cardiovascular Medicine Team  Nursing voicemail line: [phone]  Fax: [phone]  Scheduling: [phone]

## 2022-07-17 NOTE — Progress Notes
07/17/2022 11:53 AM   Chest CTA faxed to PCP Dr. Meliton Rattan per AVB for ongoing management of groundglass nodule with recommendation for repeat scan in 6-12 months. Pt is aware.    "IMPRESSION     CAD-RADS 1 / P1     1. Stenosis: Minimal luminal narrowing (less than 10%) of the proximal   LAD.     2. Plaque: Overall, there is a trace amount of coronary plaque.     3. Left dominant coronary anatomy.     RECOMMENDATION:   Considered nonatherosclerotic causes of symptoms. Consider risk factor   modification and preventative pharmacotherapy.     Other:     4. Subcentimeter groundglass nodule in the left lower lobe measuring 0.7   cm. Advise follow up chest CT in 6-12 months to confirm persistence.     5. Mild to moderate aortic valve leaflet calcification."

## 2022-08-21 ENCOUNTER — Encounter: Admit: 2022-08-21 | Discharge: 2022-08-21 | Payer: BC Managed Care – PPO

## 2022-08-21 ENCOUNTER — Ambulatory Visit: Admit: 2022-08-21 | Discharge: 2022-08-22 | Payer: BC Managed Care – PPO

## 2022-08-21 DIAGNOSIS — E139 Other specified diabetes mellitus without complications: Secondary | ICD-10-CM

## 2022-08-21 DIAGNOSIS — E119 Type 2 diabetes mellitus without complications: Secondary | ICD-10-CM

## 2022-08-21 DIAGNOSIS — E538 Deficiency of other specified B group vitamins: Secondary | ICD-10-CM

## 2022-08-21 DIAGNOSIS — E213 Hyperparathyroidism, unspecified: Secondary | ICD-10-CM

## 2022-08-21 DIAGNOSIS — I10 Essential (primary) hypertension: Secondary | ICD-10-CM

## 2022-08-21 DIAGNOSIS — M1990 Unspecified osteoarthritis, unspecified site: Secondary | ICD-10-CM

## 2022-08-21 DIAGNOSIS — E785 Hyperlipidemia, unspecified: Secondary | ICD-10-CM

## 2022-08-21 DIAGNOSIS — K219 Gastro-esophageal reflux disease without esophagitis: Secondary | ICD-10-CM

## 2022-08-21 DIAGNOSIS — M069 Rheumatoid arthritis, unspecified: Secondary | ICD-10-CM

## 2022-08-21 MED ORDER — OZEMPIC 2 MG/DOSE (8 MG/3 ML) SC PNIJ
2 mg | SUBCUTANEOUS | 3 refills | Status: AC
Start: 2022-08-21 — End: ?

## 2022-08-22 ENCOUNTER — Encounter: Admit: 2022-08-22 | Discharge: 2022-08-22 | Payer: BC Managed Care – PPO

## 2022-08-27 ENCOUNTER — Encounter: Admit: 2022-08-27 | Discharge: 2022-08-27 | Payer: BC Managed Care – PPO

## 2022-08-27 DIAGNOSIS — E139 Other specified diabetes mellitus without complications: Secondary | ICD-10-CM

## 2022-09-18 ENCOUNTER — Encounter: Admit: 2022-09-18 | Discharge: 2022-09-18 | Payer: BC Managed Care – PPO

## 2022-10-01 ENCOUNTER — Encounter: Admit: 2022-10-01 | Discharge: 2022-10-01 | Payer: BC Managed Care – PPO

## 2022-10-01 MED ORDER — METOPROLOL SUCCINATE 50 MG PO TB24
50 mg | ORAL_TABLET | Freq: Every evening | ORAL | 3 refills | 90.00000 days | Status: AC
Start: 2022-10-01 — End: ?

## 2022-11-27 ENCOUNTER — Encounter: Admit: 2022-11-27 | Discharge: 2022-11-27 | Payer: BC Managed Care – PPO

## 2022-11-27 MED ORDER — DOXYCYCLINE MONOHYDRATE 100 MG PO CAP
ORAL_CAPSULE | 0 refills
Start: 2022-11-27 — End: ?

## 2023-01-14 ENCOUNTER — Encounter: Admit: 2023-01-14 | Discharge: 2023-01-14 | Payer: BC Managed Care – PPO

## 2023-01-16 ENCOUNTER — Encounter: Admit: 2023-01-16 | Discharge: 2023-01-16 | Payer: BC Managed Care – PPO

## 2023-01-22 ENCOUNTER — Encounter: Admit: 2023-01-22 | Discharge: 2023-01-22 | Payer: BC Managed Care – PPO

## 2023-01-22 DIAGNOSIS — E213 Hyperparathyroidism, unspecified: Secondary | ICD-10-CM

## 2023-01-22 DIAGNOSIS — E139 Other specified diabetes mellitus without complications: Secondary | ICD-10-CM

## 2023-01-23 ENCOUNTER — Encounter: Admit: 2023-01-23 | Discharge: 2023-01-23 | Payer: BC Managed Care – PPO

## 2023-03-05 ENCOUNTER — Encounter: Admit: 2023-03-05 | Discharge: 2023-03-05 | Payer: BC Managed Care – PPO

## 2023-03-05 MED ORDER — METOPROLOL SUCCINATE 50 MG PO TB24
50 mg | ORAL_TABLET | Freq: Every evening | ORAL | 2 refills | 90.00000 days | Status: AC
Start: 2023-03-05 — End: ?

## 2023-03-30 ENCOUNTER — Encounter: Admit: 2023-03-30 | Discharge: 2023-03-30 | Payer: BC Managed Care – PPO

## 2023-03-30 MED ORDER — FLUDROCORTISONE 0.1 MG PO TAB
0.1 mg | ORAL_TABLET | Freq: Every day | ORAL | 1 refills
Start: 2023-03-30 — End: ?

## 2023-04-04 ENCOUNTER — Encounter: Admit: 2023-04-04 | Discharge: 2023-04-04 | Payer: BC Managed Care – PPO

## 2023-04-04 ENCOUNTER — Ambulatory Visit: Admit: 2023-04-04 | Discharge: 2023-04-05 | Payer: BC Managed Care – PPO

## 2023-04-04 DIAGNOSIS — E785 Hyperlipidemia, unspecified: Secondary | ICD-10-CM

## 2023-04-04 DIAGNOSIS — K219 Gastro-esophageal reflux disease without esophagitis: Secondary | ICD-10-CM

## 2023-04-04 DIAGNOSIS — E538 Deficiency of other specified B group vitamins: Secondary | ICD-10-CM

## 2023-04-04 DIAGNOSIS — E119 Type 2 diabetes mellitus without complications: Secondary | ICD-10-CM

## 2023-04-04 DIAGNOSIS — E213 Hyperparathyroidism, unspecified: Secondary | ICD-10-CM

## 2023-04-04 DIAGNOSIS — E1169 Type 2 diabetes mellitus with other specified complication: Secondary | ICD-10-CM

## 2023-04-04 DIAGNOSIS — M1990 Unspecified osteoarthritis, unspecified site: Secondary | ICD-10-CM

## 2023-04-04 DIAGNOSIS — M8580 Other specified disorders of bone density and structure, unspecified site: Secondary | ICD-10-CM

## 2023-04-04 DIAGNOSIS — I10 Essential (primary) hypertension: Secondary | ICD-10-CM

## 2023-04-04 DIAGNOSIS — M069 Rheumatoid arthritis, unspecified: Secondary | ICD-10-CM

## 2023-04-04 MED ORDER — MOUNJARO 15 MG/0.5 ML SC PNIJ
15 mg | SUBCUTANEOUS | 3 refills | Status: AC
Start: 2023-04-04 — End: ?

## 2023-04-04 NOTE — Patient Instructions
It was a pleasure seeing you today.      If you are having labs and/or imaging done today, please proceed to the lab on the 1st floor of the Medical Office Building or radiology on the 2nd floor.  I will be in contact once those have resulted.      For any questions regarding what we have discussed at today's visit, please send a My Chart message to my nurse, Graciella Freer, or call her extension at [phone].  All messages will get routed to me.      Additional phone numbers which might be helpful:  General nurse extension: [phone]  Clinic number: [phone] scheduling   Infusion center: [phone]  Radiology scheduling number: [phone]

## 2023-04-04 NOTE — Telephone Encounter
Philip Dodrill, DO  Graciella Freer, RN  Can we please fax all lab orders from me to [facility] hospital in [place], [place]? Thank you    RN faxed to lab at [phone]  RN sent copy to patient

## 2023-04-04 NOTE — Progress Notes
Date of Service: 04/04/2023    Subjective:             Philip Gilbert is a 62 y.o. male.    History of Present Illness    Reason for visit:  follow up, hyperparathyroidism  Last office visit: 08/2022 with Darra Lis for diabetes and in 06/2021 with Dr. Ellis Savage for hyperparathyroidism    Type 2 Diabetes mellitus  Dx: 2000     Interval history:  He is doing well today.   Remains on Ozempic 2mg  weekly.   His current weight is 162lb.  He has not noticed any fluctuations.   He does note more loose stools while on Ozempic.   Last colonoscopy was about 4 years ago, was told the next would be due in 10 years.   He will take Imodium if he knows he will be busy and unable to use restroom.   Works through [employer].     He uses [employer] for his medications.     Current DM regimen: Ozempic 2 mg every Sunday  Past treatment: Lantus, Metformin    Complications of DM:  CAD: No  CVA: No  PVD: No  Amputations: No  Retinopathy: No  Gastropathy: No  Nephropathy: No  Neuropathy: No  Depression: No  Chronic wounds / delayed healing: No  DM related hospitalizations: No     Last dilated eye exam:   Last dental exam:   Last DM education / nutritionist visit:      DM related medications:  Statin: Yes, rosuvastatin 20 mg  ACE-I: No  ASA: Yes    Secondary hyperparathyroidism  History of lap band surgery in 2012  History of Roux-en-Y gastric bypass in 2003  Hypocalciuria   03/2020 24 hour urine calcium 74 (Care Everywhere)    He is taking Vitamin D 5,000 units daily.   He is not taking calcium supplement.  He is nervous about having a kidney stones.   He cannot eat ice cream. He does try to increase dairy. He is able to tolerate other dairy products.   He has never had a kidney stones. His nephew had kidney stone at age 64.     No interval fall nor fracture.  Prednisone as needed for RA for flares, a few days every 3-4 months.      Osteopenia (risk factors: vitamin D deficiency, bariatric surgery)   -2021 negative celiac serology   - has + history of RA, on methotrexate    DXA - 01/15/2022 ([facility])  Lumbar spine: BMD 1.290 g/cm2, T-score 0.4  Mean femoral neck:  BMD 0.989 g/cm2, T-score -0.8     Latest Reference Range & Units 02/20/22 00:00   Albumin 3.4 - 4.8 g/dL 4   Calcium 8.8 - 10 mg/dL 8.5 !   PTH Hormone 16 - 77 pg/mL 129 !   Vitamin D(25-OH)Total 30 - 100 ng/mL 31          ROS  Denies unintentional weight loss/weight gain, fatigue, fever, chills, N/V, abdominal pain, heat/cold intolerance, constipation/diarrhea, polyuria, polydipsia, rash, headache or vision change      Objective:          ALPRAZolam (XANAX) 0.5 mg tablet Take one tablet by mouth twice daily as needed.    aspirin EC 81 mg tablet Take one tablet by mouth daily.    buPROPion XL (WELLBUTRIN XL) 300 mg tablet Take one tablet by mouth every morning. Do not crush or chew.    celecoxib (  CELEBREX) 200 mg capsule Take one capsule by mouth daily.    cyanocobalamin 1,000 mcg tablet Take one tablet by mouth twice daily.    doxycycline (MONODOX) 100 mg capsule Take one capsule by mouth daily.    ergocalciferol (vitamin D2) (VITAMIN D PO) Take 2,000 Units by mouth daily.    eszopiclone (LUNESTA) 3 mg tablet Take one tablet by mouth nightly as needed.    ezetimibe (ZETIA) 10 mg tablet Take one tablet by mouth daily.    finasteride (PROPECIA) 1 mg tablet Take one tablet by mouth daily.    fludrocortisone (FLORINEF) 0.1 mg tablet Take 1 tablet by mouth daily.    folic acid (FOLVITE) 1 mg tablet Take one tablet by mouth daily.    gabapentin (NEURONTIN) 600 mg tablet TAKE 1 TABLET BY MOUTH TWICE DAILY    hydrOXYchloroQUINE (PLAQUENIL) 200 mg tablet Take two tablets by mouth daily.    methotrexate sodium (RHEUMATREX) 2.5 mg tablet TAKE 8 TABLETS BY MOUTH EVERY WEEK    metoprolol succinate XL (TOPROL XL) 50 mg extended release tablet Take 1 tablet by mouth daily at bedtime.    omeprazole DR (PRILOSEC) 40 mg capsule TAKE 1 CAPSULE BY MOUTH DAILY promethazine-codeine (PHENERGAN W/CODEINE) 6.25-10 mg/5 mL oral syrup Take 5 mL by mouth every 6 hours as needed for Cough.    rosuvastatin (CRESTOR) 20 mg tablet Take one tablet by mouth daily.    semaglutide (OZEMPIC) 2 mg/dose (8 mg/3 mL) injection PEN Inject two mg under the skin every 7 days.    silodosin (RAPAFLO) 8 mg capsule Take one capsule by mouth daily.    traMADoL (ULTRAM) 50 mg tablet Take one tablet by mouth at bedtime daily.     There were no vitals filed for this visit.  There is no height or weight on file to calculate BMI.     Physical Exam  General: alert, oriented to person, place, time event, no apparent distress  Head: normocephalic, atraumatic  Neck: no gross thyromegaly  Eyes: EOMI grossly intact  Pulm: no respiratory distress on room air  Psych: mood and affect congruent      Comprehensive Metabolic Profile    Lab Results   Component Value Date/Time    NA 140 01/18/2023 12:00 AM    K 4.3 01/18/2023 12:00 AM    CL 105 01/18/2023 12:00 AM    CO2 25 01/18/2023 12:00 AM    GAP 10 01/18/2023 12:00 AM    BUN 12.3 01/18/2023 12:00 AM    CR 1.04 01/18/2023 12:00 AM    GLU 100 01/18/2023 12:00 AM    Lab Results   Component Value Date/Time    CA 8.5 (A) 01/18/2023 12:00 AM    PO4 3.3 01/06/2021 11:06 AM    ALBUMIN 3.6 01/18/2023 12:00 AM    TOTPROT 5.7 (A) 01/18/2023 12:00 AM    ALKPHOS 66 01/18/2023 12:00 AM    AST 29 01/18/2023 12:00 AM    ALT 31 01/18/2023 12:00 AM    TOTBILI 2.02 (A) 01/18/2023 12:00 AM    GFR 80.7 01/18/2023 12:00 AM    GFRAA >60 11/05/2019 01:18 PM        Hemoglobin A1C   Date Value Ref Range Status   08/23/2022 4.7 <5.7 % Final   02/20/2022 5.4 <5.7 % Final   05/29/2021 5.3  Final     TSH   Date Value Ref Range Status   01/18/2023 1.36 0.35 - 4.94 MIU/mL Final   08/31/2021 1.350  Final  05/29/2021 1.980  Final     TSH Thyroid Screen   Date Value Ref Range Status   02/20/2022 1.23 0.35 - 4.94 mIU/mL Final       No results found for: MCALB24   Microalbumin/CR ratio Urine Date Value Ref Range Status   08/23/2022 4.2 <30 Final     Microalbumin, Random   Date Value Ref Range Status   08/23/2022 7 mg/L Final     LDL   Date Value Ref Range Status   01/18/2023 42 <100 mg/dL Final   16/03/9603 57  Final     HDL   Date Value Ref Range Status   01/18/2023 55 >40 mg/dL Final   54/02/8118 71  Final     Triglycerides   Date Value Ref Range Status   01/18/2023 45 <150 mg/dL Final   14/78/2956 56  Final     Cholesterol   Date Value Ref Range Status   01/18/2023 106 <200 mg/dL Final   21/30/8657 846  Final            Assessment and Plan:    Type 2 DM  No updated A1c available for review.  No ambulatory glucose data for review.    He has continued to have weight loss, down to 162lb now on Ozempic 2mg .  I discussed switching Ozempic to [facility], given he does have consistent symptoms of loose stools.  He is agreeable to trying this.     Plan:  - update annual labs  - stop Ozempic 2 mg  - begin Mounjaro 15mg  weekly  - POC home glucose checks  - annual eye exam  - will need in person visit at follow up for foot exam    Secondary hyperparathyroidism  History of lap band surgery in 2012  History of Roux-en-Y gastric bypass in 2003  History of osteopenia  Hypocalciuria   03/2020 24 hour urine calcium 74 (Care Everywhere)    Secondary hyperparathyroidism due to hypocalcemia and history of Vitamin D deficiency in the setting of prior bariatric surgery    - update parathyroid and calcium labs today  - discussed importance of 1,200mg  daily calcium through diet or supplement  - remains on Vitamin D 5,000 units daily    He would like labs done at [facility] in [place], [place].     RTC 6 months with Salvadore Dom D.O.   Endocrinology, Metabolism, Clinical Pharmacology   Pager: [phone]  Available on Voalte

## 2023-04-19 ENCOUNTER — Encounter: Admit: 2023-04-19 | Discharge: 2023-04-19 | Payer: BC Managed Care – PPO

## 2023-05-05 ENCOUNTER — Encounter: Admit: 2023-05-05 | Discharge: 2023-05-05 | Payer: BC Managed Care – PPO

## 2023-05-06 ENCOUNTER — Encounter: Admit: 2023-05-06 | Discharge: 2023-05-06 | Payer: BC Managed Care – PPO

## 2023-05-07 ENCOUNTER — Encounter: Admit: 2023-05-07 | Discharge: 2023-05-07 | Payer: BC Managed Care – PPO

## 2023-05-07 MED ORDER — MOUNJARO 10 MG/0.5 ML SC PNIJ
10 mg | SUBCUTANEOUS | 3 refills | Status: AC
Start: 2023-05-07 — End: ?

## 2023-05-10 ENCOUNTER — Encounter: Admit: 2023-05-10 | Discharge: 2023-05-10 | Payer: BC Managed Care – PPO

## 2023-05-12 ENCOUNTER — Encounter: Admit: 2023-05-12 | Discharge: 2023-05-12 | Payer: BC Managed Care – PPO

## 2023-05-13 ENCOUNTER — Encounter: Admit: 2023-05-13 | Discharge: 2023-05-13 | Payer: BC Managed Care – PPO

## 2023-05-13 MED ORDER — MOUNJARO 2.5 MG/0.5 ML SC PNIJ
2.5 mg | SUBCUTANEOUS | 3 refills | Status: AC
Start: 2023-05-13 — End: ?

## 2023-05-13 NOTE — Progress Notes
Cardiovascular Medicine     Date of Service: 05/14/2023    HPI       I had the pleasure of seeing Philip Gilbert at the [facility] [place] [facility].      Philip Gilbert is a pleasant 62 year old male with a history of dyslipidemia, Type 2 Diabetes, obesity, status post lab band surgery in 2012 and Roux - en -Y gastric bypass in 2003, secondary hyperparathyroidism, rheumatoid arthritis.  He follows with Dr Sarita Bottom for cardiology.  His previous cardiac testing revealed a minimal plaque in a coronary CTA, echocardiogram 08/01/21 with normal ejection fraction and valvular structure and function, a cardiac monitor in 04/2022 with no significant findings.      Today he is reporting lightheadedness and dizziness for at least one week.  He has been on ozempic and was recently switched to monjouro and has lost significant weight.  He also became very constipated and went to ED in [place], [place].  The constipation was relieved and he received a liter of IV fluid.  He is not aware if they drew blood work.  The next morning (Saturday) he was lightheaded all day.  He is now holding his monjouro for 2 weeks and will restart at a lower dose.      We performed orthostatic blood pressures in our office which did not show changes in blood pressure or heart rates.  His ECG was also normal sinus rhythm.  We reviewed his diet the last few weeks and he is eating very little due to constipation and decreased appetite.  He is taking flourinef and tolerating it well.  He denies chest pain, shortness of breath, he only notices the lightheadedness when standing.            Cardiovascular Health Factors       Vitals:    05/14/23 1103 05/14/23 1113 05/14/23 1115 05/14/23 1118   BP: 128/88 (!) 121/91 116/90 124/89   BP Source: Arm, Right Upper Arm, Right Upper Arm, Right Upper Arm, Right Upper   Pulse: 66 72 77 74   SpO2: 98%      O2 Device: None (Room air)      PainSc: Zero      Weight: 70.3 kg (154 lb 14.4 oz) Height: 175.3 cm (5' 9)        Body mass index is 22.87 kg/m?Marland Kitchen     Past Medical History  Patient Active Problem List    Diagnosis Date Noted    Hyperparathyroidism (HCC) 08/21/2022    Coronary artery disease involving native coronary artery of native heart without angina pectoris 07/17/2022    Suspected arrhythmia 04/25/2022    Elevated liver enzymes 02/14/2022    Orthostatic hypotension 09/06/2021    Neurocardiogenic syncope 07/05/2021    Prostate cancer screening     Lower urinary tract symptoms (LUTS)     Major depressive disorder, recurrent, moderate (HCC) 12/24/2019    Low energy 12/16/2019    Dupuytren's contracture of left hand 12/16/2019    Chronic idiopathic constipation 11/05/2019    Hx of laparoscopic gastric banding 11/05/2019    S/P inguinal hernia repair 11/05/2019    Vitamin B12 deficiency 11/05/2019    Medication management 11/05/2019    Healthcare maintenance 11/05/2019    Weight loss 11/05/2019    Appetite loss 11/05/2019    Diabetes 1.5, 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   Cardiovascular:  Positive for near-syncope.   Gastrointestinal:  Positive for constipation.       Physical Exam:  General Appearance: no acute distress  Skin: warm & intact  Neck Veins: neck veins are flat & not distended  Carotid Arteries: no bruits  Chest Inspection: chest is normal in appearance  Auscultation/Percussion: lungs clear to auscultation, no rales, rhonchi, or wheezing  Cardiac Rhythm: regular rhythm & normal rate  Cardiac Auscultation: Normal S1 & S2.    Murmurs: no cardiac murmurs   Extremities: no lower extremity edema; 2+ symmetric distal pulses  Abdominal Exam: soft, non-tender, bowel sounds normal  Neurologic Exam: oriented to time, place and person; no focal neurologic deficits  Psychiatric: Normal mood and affect.  Behavior is normal. Judgment and thought content normal.        Cardiovascular Studies  Preliminary EKG from today:  Sinus rhythm 66 bpm   Most recent results for 12-Lead ECG   ECG 12-LEAD    Collection Time: 05/14/23 11:09 AM   Result Value Status    VENTRICULAR RATE 66 Incomplete    P-R INTERVAL 166 Incomplete    QRS DURATION 96 Incomplete    Q-T INTERVAL 410 Incomplete    QTC CALCULATION (BAZETT) 429 Incomplete    P AXIS 84 Incomplete    R AXIS 60 Incomplete    T AXIS 62 Incomplete    Impression    Normal sinus rhythm  Normal ECG  When compared with ECG of 17-Jul-2022 11:16,  No significant change was found          Cardiovascular Health Factors  Vitals BP Readings from Last 3 Encounters:   05/14/23 124/89   01/16/23 112/76   07/17/22 110/69     Wt Readings from Last 3 Encounters:   05/14/23 70.3 kg (154 lb 14.4 oz)   01/16/23 78 kg (172 lb)   07/17/22 78.9 kg (174 lb)     BMI Readings from Last 3 Encounters:   05/14/23 22.87 kg/m?   01/16/23 25.40 kg/m?   07/17/22 25.70 kg/m?      Smoking Social History     Tobacco Use   Smoking Status Never   Smokeless Tobacco Never      Lipid Profile Cholesterol   Date Value Ref Range Status   05/08/2023 106 <200 mg/dL Final     HDL   Date Value Ref Range Status   05/08/2023 58 >=40 mg/dL Final     LDL   Date Value Ref Range Status   05/08/2023 37 <100 mg/dL Final     Triglycerides   Date Value Ref Range Status   05/08/2023 59 <150 mg/dL Final      Blood Sugar Hemoglobin A1C   Date Value Ref Range Status   08/23/2022 4.7 <5.7 % Final     Glucose   Date Value Ref Range Status   05/08/2023 96 70 - 105 mg/dL Final   45/40/9811 914 70 - 105 mg/dL Final   78/29/5621 93 70 - 105 mg/dL Final     Glucose Fasting   Date Value Ref Range Status   12/17/2019 109 (H) 70 - 100 MG/DL Final     Glucose POC   Date Value Ref Range Status   09/08/2019 207 (A) 65 -  110 mg/dL Final   14/78/2956 93 65 - 110 mg/dL Final          Problems Addressed Today  Encounter Diagnoses   Name Primary?    Cardiovascular symptoms Yes    Lightheadedness        Assessment and Plan        Neurocardiogenic syncope/orthostatic hypotension  He is having increased lightheadedness likely multifactorial.  He states he feels lightheaded, I ordered lab work today,  encouraged hydration. 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  We discussed safety measures including having a chair behind him when he stands.  Discussed recent addition of monjouro to his medications and recent hold on the medication and to restart at a lower dose.  This is managed by Dr Daphane Shepherd at [facility].     Coronary artery disease  He had mild nonobstructive CAD on coronary CT.  Continue risk factor management.  He is on aspirin and statin therapy.  He has no current chest pain.       Dyslipidemia  He is on ezetimibe 10 mg daily and Crestor 20 mg daily.      Hypertension  His blood pressure is controlled today on current therapy.  Target goal is 120/80, he was no orthostaic.                Tiburcio Pea, APRN-CNS      Total Time Today was 30 minutes in the following activities: Preparing to see the patient, Obtaining and/or reviewing separately obtained history, and Performing a medically appropriate examination and/or evaluation      This note was partially dictated using Dragon Medical One speech recognition software.  Occasional wrong-word or sound-alike substitutions may have occurred due to the inherent limitations of voice-recognition software.  Please read the chart carefully and recognize, using context, where the substitutions may have occurred.  Please do not hesitate to contact me for clarification.            Current Medications (including today's revisions)   ALPRAZolam (XANAX) 0.5 mg tablet Take one tablet by mouth twice daily as needed.    aspirin EC 81 mg tablet Take one tablet by mouth daily. buPROPion XL (WELLBUTRIN XL) 300 mg tablet Take one tablet by mouth every morning. Do not crush or chew.    celecoxib (CELEBREX) 200 mg capsule Take one capsule by mouth daily.    cyanocobalamin 1,000 mcg tablet Take one tablet by mouth twice daily.    doxycycline (MONODOX) 100 mg capsule Take one capsule by mouth daily.    ergocalciferol (vitamin D2) (VITAMIN D PO) Take 2,000 Units by mouth daily. (Patient not taking: Reported on 05/14/2023)    eszopiclone (LUNESTA) 3 mg tablet Take one tablet by mouth nightly as needed.    ezetimibe (ZETIA) 10 mg tablet Take one tablet by mouth daily.    finasteride (PROPECIA) 1 mg tablet Take one tablet by mouth daily.    fludrocortisone (FLORINEF) 0.1 mg tablet Take 1 tablet by mouth daily.    folic acid (FOLVITE) 1 mg tablet Take one tablet by mouth daily.    gabapentin (NEURONTIN) 600 mg tablet TAKE 1 TABLET BY MOUTH TWICE DAILY    hydrOXYchloroQUINE (PLAQUENIL) 200 mg tablet Take two tablets by mouth daily.    methotrexate sodium (RHEUMATREX) 2.5 mg tablet TAKE 8 TABLETS BY MOUTH EVERY WEEK    metoprolol succinate XL (TOPROL XL) 50 mg extended release tablet Take 1 tablet by mouth daily at bedtime. (  Patient not taking: Reported on 05/14/2023)    omeprazole DR (PRILOSEC) 40 mg capsule TAKE 1 CAPSULE BY MOUTH DAILY    promethazine-codeine (PHENERGAN W/CODEINE) 6.25-10 mg/5 mL oral syrup Take 5 mL by mouth every 6 hours as needed for Cough.    rosuvastatin (CRESTOR) 20 mg tablet Take one tablet by mouth daily.    silodosin (RAPAFLO) 8 mg capsule Take one capsule by mouth daily.    tirzepatide (MOUNJARO) 2.5 mg/0.5 mL injector PEN Inject 0.5 mL under the skin every 7 days.    traMADoL (ULTRAM) 50 mg tablet Take one tablet by mouth at bedtime daily.

## 2023-05-14 ENCOUNTER — Encounter: Admit: 2023-05-14 | Discharge: 2023-05-14 | Payer: BC Managed Care – PPO

## 2023-05-14 ENCOUNTER — Ambulatory Visit: Admit: 2023-05-14 | Discharge: 2023-05-15 | Payer: BC Managed Care – PPO

## 2023-05-14 ENCOUNTER — Ambulatory Visit: Admit: 2023-05-14 | Discharge: 2023-05-14 | Payer: BC Managed Care – PPO

## 2023-05-14 DIAGNOSIS — R0989 Other specified symptoms and signs involving the circulatory and respiratory systems: Secondary | ICD-10-CM

## 2023-05-14 DIAGNOSIS — R42 Dizziness and giddiness: Secondary | ICD-10-CM

## 2023-05-14 NOTE — Patient Instructions
It was a pleasure to see you today! Your provider would like to do the following:          Follow up:    If you need to schedule an appointment, please call [phone]. (For appointments six months or further out, call approximatley three months before appointment is due, for best availability.)     Contacting our office:    -Business Hours: Monday-Friday, 8:00 am-4:30 pm (excluding Holidays).     -For medical questions or concerns, please send [name] a message through your MyChart account or call the [facility] team nursing ( Tori RN and Zackery Barefoot ) triage line at [phone]. Please leave a detailed message with your name, date of birth, and reason for your call.  If your message is received before 3:30pm, every effort will be made to call you back the same day.  Please allow time for [name] to review your chart prior to call back.     -For medication refills please start by contacting your pharmacy. You can also send [name] a prescription question through your MyChart or call the nurse triage line above.     -[facility] nursing team fax number: [phone].    -You may receive a survey in the upcoming weeks from [facility]. Your feedback is important to [name] and helps [name] continue to improve patient care and patient satisfaction.     -Please feel free to call our Financial Department at [phone] with any questions or concerns about estimated cost of testing or imaging ordered today. We are happy to provide CPT codes upon request.    Results & Testing Follow Up:    -Please allow 5-7 business days for the results of any testing to be reviewed. Please call our office if you have not heard from a nurse within this time frame.    -Should you choose to complete testing at an outside facility, please contact our office after completion of testing so that we can ensure that we have received results for your provider to review.    Lab and test results:  As a part of the CARES act, starting 09/17/2019, some results will be released to you via MyChart immediately and automatically.  You may see results before your provider sees them; however, your provider will review all these results and then they, or one of their team, will notify you of result information and recommendations.   Critical results will be addressed immediately, but otherwise, please allow [name] time to get back with you prior to you reaching out to [name] for questions.  This will usually take about 72 hours for labs and 5-7 days for procedure test results.    Please contact our office with any questions or concerns.

## 2023-05-21 ENCOUNTER — Encounter: Admit: 2023-05-21 | Discharge: 2023-05-21 | Payer: BC Managed Care – PPO

## 2023-05-26 ENCOUNTER — Encounter: Admit: 2023-05-26 | Discharge: 2023-05-26 | Payer: BC Managed Care – PPO

## 2023-05-28 ENCOUNTER — Encounter: Admit: 2023-05-28 | Discharge: 2023-05-28 | Payer: BC Managed Care – PPO

## 2023-06-03 ENCOUNTER — Encounter: Admit: 2023-06-03 | Discharge: 2023-06-03 | Payer: BC Managed Care – PPO

## 2023-06-03 MED ORDER — METOPROLOL SUCCINATE 50 MG PO TB24
50 mg | ORAL_TABLET | Freq: Every evening | ORAL | 3 refills | 90.00000 days | Status: AC
Start: 2023-06-03 — End: ?

## 2023-06-03 NOTE — Telephone Encounter
06/03/2023 5:35 PM   OV utd - recently seen by Drucie Ip, NP. BP and HR wnl at that time. Refill sent per protocol.

## 2023-06-18 ENCOUNTER — Encounter: Admit: 2023-06-18 | Discharge: 2023-06-18 | Payer: BC Managed Care – PPO

## 2023-06-27 ENCOUNTER — Encounter: Admit: 2023-06-27 | Discharge: 2023-06-27 | Payer: BC Managed Care – PPO

## 2023-07-10 ENCOUNTER — Encounter: Admit: 2023-07-10 | Discharge: 2023-07-10 | Payer: BC Managed Care – PPO

## 2023-07-25 ENCOUNTER — Encounter: Admit: 2023-07-25 | Discharge: 2023-07-25 | Payer: BC Managed Care – PPO

## 2023-07-26 ENCOUNTER — Encounter: Admit: 2023-07-26 | Discharge: 2023-07-26 | Payer: BC Managed Care – PPO

## 2023-07-26 DIAGNOSIS — E213 Hyperparathyroidism, unspecified: Secondary | ICD-10-CM

## 2023-07-26 DIAGNOSIS — E1169 Type 2 diabetes mellitus with other specified complication: Secondary | ICD-10-CM

## 2023-07-26 NOTE — Telephone Encounter
RN faxed lab orders to [facility] and sent orders to pt

## 2023-07-29 ENCOUNTER — Encounter: Admit: 2023-07-29 | Discharge: 2023-07-29 | Payer: BC Managed Care – PPO

## 2023-08-08 ENCOUNTER — Encounter: Admit: 2023-08-08 | Discharge: 2023-08-08 | Payer: BC Managed Care – PPO

## 2023-08-22 ENCOUNTER — Encounter: Admit: 2023-08-22 | Discharge: 2023-08-22 | Payer: BC Managed Care – PPO

## 2023-08-22 DIAGNOSIS — E213 Hyperparathyroidism, unspecified: Secondary | ICD-10-CM

## 2023-08-22 DIAGNOSIS — E1169 Type 2 diabetes mellitus with other specified complication: Secondary | ICD-10-CM

## 2023-08-22 LAB — COMPREHENSIVE METABOLIC PANEL
ALBUMIN: 3.3 g/dL (ref 3.4–4.8)
ALK PHOSPHATASE: 69 U/L (ref 40–150)
ALT: 73 U/L (ref <45)
ANION GAP: 8 meq/L (ref 8–16)
AST: 72 U/L (ref 11–34)
BLD UREA NITROGEN: 10 mg/dL (ref 8.4–25.7)
CALCIUM: 8 mg/dL (ref 8.4–10.2)
CHLORIDE: 105 mmol/L (ref 98–107)
CO2: 27 mmol/L (ref 23–31)
CREATININE: 0.9 mg/dL (ref 0.72–1.25)
GFR ESTIMATED: 87 mL/min/{1.73_m2} (ref >59)
GLUCOSE,PANEL: 94 mg/dL (ref 70–105)
POTASSIUM: 4.6 mmol/L (ref 3.5–5.1)
SODIUM: 140 mmol/L (ref 136–146)
TOTAL BILIRUBIN: 1 mg/dL (ref 0.2–1.2)
TOTAL PROTEIN: 5.6 g/dL (ref 6.4–8.3)

## 2023-08-24 ENCOUNTER — Encounter: Admit: 2023-08-24 | Discharge: 2023-08-24 | Payer: BC Managed Care – PPO

## 2023-08-27 ENCOUNTER — Encounter: Admit: 2023-08-27 | Discharge: 2023-08-27 | Payer: BC Managed Care – PPO

## 2023-09-03 ENCOUNTER — Encounter: Admit: 2023-09-03 | Discharge: 2023-09-03 | Payer: BC Managed Care – PPO

## 2023-09-06 ENCOUNTER — Encounter: Admit: 2023-09-06 | Discharge: 2023-09-06 | Payer: BC Managed Care – PPO

## 2023-09-13 ENCOUNTER — Encounter: Admit: 2023-09-13 | Discharge: 2023-09-13 | Payer: BC Managed Care – PPO

## 2023-09-13 ENCOUNTER — Ambulatory Visit: Admit: 2023-09-13 | Discharge: 2023-09-14

## 2023-09-13 ENCOUNTER — Encounter: Admit: 2023-09-13 | Discharge: 2023-09-13

## 2023-09-13 DIAGNOSIS — R55 Syncope and collapse: Secondary | ICD-10-CM

## 2023-09-13 DIAGNOSIS — E782 Mixed hyperlipidemia: Secondary | ICD-10-CM

## 2023-09-13 DIAGNOSIS — I2510 Atherosclerotic heart disease of native coronary artery without angina pectoris: Secondary | ICD-10-CM

## 2023-09-13 DIAGNOSIS — I479 Paroxysmal tachycardia, unspecified: Secondary | ICD-10-CM

## 2023-09-13 DIAGNOSIS — I951 Orthostatic hypotension: Secondary | ICD-10-CM

## 2023-09-13 DIAGNOSIS — I10 Essential (primary) hypertension: Secondary | ICD-10-CM

## 2023-09-13 NOTE — Progress Notes
 Date of Service: 09/13/2023      CHIEF COMPLAINT: Neurocardiogenic syncope         HISTORY OF PRESENT ILLNESS:  Philip Gilbert is a 63 y.o. male.     The patient presents for a cardiology follow-up regarding lightheadedness and medication management.    His lightheadedness episodes have been very infrequent recently. No recent episodes of blacking out, chest pain, difficulty breathing, chest tightness, or falls. Able to engage in activities such as walking and exercise without issues.    Currently on a lower dose of Mounjaro due to previous issues with constipation and significant weight loss. Has since gained some weight back. Continues to take aspirin, Crestor, Zetia, and Florinef (fludrocortisone) at a dosage of 0.1 mg once a day.    Does not sleep much, although no specific sleep disorder is discussed. 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      PMH (CARDIAC AND RELATED):    Hypertension  Neurocardiogenic syncope  Hypercholesterolemia  Type 2 diabetes.  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  Secondary hyperparathyroidism.  Rheumatoid arthritis    CARDIOVASCULAR TESTS    CORONARY CTA:  Coronary CTA.  January 2024.  Images personally reviewed.  Normal left main.  Minimal calcific plaque in the proximal LAD but no luminal stenosis.  No disease in the dominant left circumflex small dominant RCA.     ECHOCARDIOGRAM  August 01, 2021.  Normal LV function with concentric remodeling and EF of 60%.  No wall motion abnormalities.  Normal diastolic function.  Normal right ventricular systolic function.  Sclerotic aortic valve.  No hemodynamically significant valvular abnormalities.  Normal biatrial sizes.     EKG  07/17/2022.  Ordered and reviewed.  Sinus bradycardia with heart rate 59.  Otherwise normal EKG.     07/05/2021.  Ordered and reviewed.  Normal sinus rhythm.  No pathologic Q-waves.  No evidence of LVH.  No significant ST-T wave changes         CURRENT MEDICATIONS: (including today's revisions)     ALPRAZolam (XANAX) 0.5 mg tablet Take one tablet by mouth twice daily as needed.    aspirin EC 81 mg tablet Take one tablet by mouth daily.    buPROPion XL (WELLBUTRIN XL) 300 mg tablet Take one tablet by mouth every morning. Do not crush or chew.    celecoxib (CELEBREX) 200 mg capsule Take one capsule by mouth daily.    doxycycline (MONODOX) 100 mg capsule Take one capsule by mouth daily.    ergocalciferol (vitamin D2) (VITAMIN D PO) Take 2,000 Units by mouth daily.    eszopiclone (LUNESTA) 3 mg tablet Take one tablet by mouth nightly as needed.    ezetimibe (ZETIA) 10 mg tablet Take one tablet by mouth daily.    finasteride (PROPECIA) 1 mg tablet Take one tablet by mouth daily.    fludrocortisone (FLORINEF) 0.1 mg tablet Take 1 tablet by mouth daily.    folic acid (FOLVITE) 1 mg tablet Take one tablet by mouth daily.    gabapentin (NEURONTIN) 600 mg tablet TAKE 1 TABLET BY MOUTH TWICE DAILY    hydrOXYchloroQUINE (PLAQUENIL) 200 mg tablet Take two tablets by mouth daily.    methotrexate sodium (RHEUMATREX) 2.5 mg tablet TAKE 8 TABLETS BY MOUTH EVERY WEEK (Patient not taking: Reported on 09/13/2023)    metoprolol succinate XL (TOPROL XL) 50 mg extended release tablet Take  1 tablet by mouth daily at bedtime.    MOUNJARO 2.5 mg/0.5 mL injector PEN Inject 0.5 mL under the skin every 7 days.    omeprazole DR (PRILOSEC) 40 mg capsule TAKE 1 CAPSULE BY MOUTH DAILY    promethazine-codeine (PHENERGAN W/CODEINE) 6.25-10 mg/5 mL oral syrup Take 5 mL by mouth every 6 hours as needed for Cough.    rosuvastatin (CRESTOR) 20 mg tablet Take 1 tablet by mouth once daily.    silodosin (RAPAFLO) 8 mg capsule Take one capsule by mouth daily.    traMADoL (ULTRAM) 50 mg tablet Take one tablet by mouth at bedtime daily.       ALLERGIES:  Allergies   Allergen Reactions    Ropinirole FLUSHING (SKIN), PHOTOSENSITIVITY, SWEATING and DIZZINESS     Other reaction(s): Lightheadedness         Vitals:    09/13/23 1049   BP: 123/74   BP Source: Arm, Left Upper   SpO2: 97%   O2 Device: None (Room air)   PainSc: Zero   Weight: 82.1 kg (181 lb)   Height: 175.3 cm (5' 9)     Body mass index is 26.73 kg/m?Marland Kitchen  Body surface area is 2 meters squared.    BP Readings from Last 5 Encounters:   09/13/23 123/74   05/14/23 124/89   01/16/23 112/76   07/17/22 110/69   07/12/22 109/69     Wt Readings from Last 5 Encounters:   09/13/23 82.1 kg (181 lb)   05/14/23 70.3 kg (154 lb 14.4 oz)   01/16/23 78 kg (172 lb)   07/17/22 78.9 kg (174 lb)   10/10/21 81.5 kg (179 lb 11.2 oz)         PHYSICAL EXAMINATION:  GENERAL: Patient is resting comfortably, in no apparent distress, alert and oriented.   NECK: No jugular venous distention.    CHEST: Chest is clear to auscultation bilaterally with no wheezes, crackles or rhonchi.  CARDIOVASCULAR: S1 and S2 are normal and regular.  No significant murmur auscultated.   EXTREMITIES: No edema bilaterally.      LABS:  Available and relevant labs from EMR and outside facility were reviewed      Hemoglobin   Date Value Ref Range Status   05/14/2023 13.3 (L) 13.5 - 16.5 g/dL Final     Platelet Count   Date Value Ref Range Status   05/14/2023 187 150 - 400 10*3/uL Final     Creatinine   Date Value Ref Range Status   08/22/2023 0.97 0.72 - 1.25 mg/dL Final     Potassium   Date Value Ref Range Status   08/22/2023 4.6 3.5 - 5.1 mmol/L Final     Vitamin D (25-OH) Total   Date Value Ref Range Status   05/14/2023 51 30 - 80 ng/mL Final     TSH   Date Value Ref Range Status   01/18/2023 1.36 0.35 - 4.94 MIU/mL Final     AST (SGOT)   Date Value Ref Range Status   08/22/2023 72 11 - 34 U/L Final     ALT (SGPT)   Date Value Ref Range Status   08/22/2023 73 <45 U/L Final        Cholesterol   Date Value Ref Range Status   05/08/2023 106 <200 mg/dL Final     HDL   Date Value Ref Range Status   05/08/2023 58 >=40 mg/dL Final     LDL   Date Value Ref Range Status  05/08/2023 37 <100 mg/dL Final     Triglycerides   Date Value Ref Range Status   05/08/2023 59 <150 mg/dL Final              ASSESSMENT:    1. Neurocardiogenic syncope    2. Orthostatic hypotension    3. Coronary artery disease involving native coronary artery of native heart without angina pectoris    4. Benign hypertension    5. Mixed hyperlipidemia        PLAN and RECOMMENDATIONS:    1.  Neurocardiogenic syncope/orthostatic hypotension  Symptoms have significantly improved after lowering the dose of Mounjaro.  His blood pressures at home have been between 100 220 mmHg on average.  Blood pressure logs were reviewed.  His symptoms have also significantly improved.  Continue current dose of fludrocortisone.  Recommendations for continued fluid intake was discussed.    2.  CAD  Mild nonobstructive CAD on coronary CTA.  Continue aspirin.  No symptoms of angina.  Continue risk factor modification.    3.  Hypercholesterolemia:  Target goal : High intensity statin and/or LDL <70.  Continue current regimen with very good control of lipids with LDL less than 50.  Patient will continue to have ongoing lab work to confirm adequate control and for monitoring of secondary complications.  Lifestyle management and regular exercise to maintain optimal weight was discussed.    Follow-up in 1 year, earlier if needed.    Thank you for allowing me to take part in this patient's care. Please not hesitate to contact me with questions or concerns.     Mady Gemma, MD, [facility], [facility]  Department of Cardiovascular Medicine               Patient was instructed to followup with PCP for routine medical care and health maintenance and screening studies.  Prescription drug management:   Patient's current medication list was reviewed and reconciled. Continue current medications with changes, if any detailed above. Refills provided as needed until next advised office visit.    This note was dictated using the [employer] manager.  Transcription errors may occur with the use of this software. Editing and proofreading were done by the author of this document.  In spite of the author's best effort to identify every error introduced by voice to text dictation, some errors that may represent misspelling or misstatements of what was dictated may persist.  If there are questions about content in this document please contact Dr. Leane Para.    There are no Patient Instructions on file for this visit.

## 2023-09-13 NOTE — Patient Instructions
 Thank you for visiting our office today and for choosing [facility] for your cardiac care.     We recommend follow up in one year. Please call [phone] in six months to schedule the office visit Dr. Sarita Bottom ordered.     Please continue to take your medications as ordered. Check your list with the bottles you have at home, and let us know if there are any differences.     Should you have any additional questions or concerns, please message [name] through MyChart or call the office. If you need an answer more quickly than within four hours, please call (do not send MyChart message).    If you are seen at a local hospital emergency room before you next visit, we need to know so that we can request records. Please MyChart message [name] with the name of the hospital you were see at, and why you went in.     For after-hours, weekend or holiday urgent concerns, please call the on-call provider at [phone].     Thank you,    Cardiovascular Medicine Team  Nursing voicemail line: [phone]  Fax: [phone]  Scheduling: [phone]

## 2023-09-14 ENCOUNTER — Encounter: Admit: 2023-09-14 | Discharge: 2023-09-14

## 2023-09-17 ENCOUNTER — Encounter: Admit: 2023-09-17 | Discharge: 2023-09-17

## 2023-09-17 MED ORDER — MOUNJARO 5 MG/0.5 ML SC PNIJ
5 mg | SUBCUTANEOUS | 3 refills | Status: AC
Start: 2023-09-17 — End: ?

## 2023-10-01 ENCOUNTER — Encounter: Admit: 2023-10-01 | Discharge: 2023-10-01 | Payer: PRIVATE HEALTH INSURANCE

## 2023-10-17 ENCOUNTER — Encounter: Admit: 2023-10-17 | Discharge: 2023-10-17 | Payer: PRIVATE HEALTH INSURANCE

## 2023-10-17 ENCOUNTER — Ambulatory Visit: Admit: 2023-10-17 | Discharge: 2023-10-18 | Payer: PRIVATE HEALTH INSURANCE

## 2023-10-17 DIAGNOSIS — E213 Hyperparathyroidism, unspecified: Secondary | ICD-10-CM

## 2023-10-17 DIAGNOSIS — E139 Other specified diabetes mellitus without complications: Secondary | ICD-10-CM

## 2023-10-17 MED ORDER — MOUNJARO 5 MG/0.5 ML SC PNIJ
5 mg | SUBCUTANEOUS | 11 refills | 28.00000 days | Status: AC
Start: 2023-10-17 — End: ?

## 2023-10-17 NOTE — Progress Notes
 Date of Service: 10/17/2023    Subjective:       Philip Gilbert is a 63 y.o. male.    History of Present Illness    The patient presents to the [facility] at [facility] for evaluation and management of diabetes mellitus. Pt was last seen by Dr. Aisha Ali in October 2024 for hyperparathyroidism and diabetes, previously with me in March 2024.    At his last visit they changed from Ozempic 2 mg to Mounjaro 15 mg, this caused a lot of constipation, it was then reduced to 5 mg. Has been tolerating well.      Family history: Brother with Type 1 Diabetes    Other history: hyperparathyroid, lap band in 2012, gerd, BPH, HTN, and rheumatoid arthritis, last use of alcohol 8 years ago    Type 2 Diabetes mellitus  Dx: 2000     A1c today in clinic: 4.9%  POC glucose91    Current DM regimen: Mounjaro 5 mg each week  Past treatment: Lantus, Metformin, Ozempic  Adherence to medications: all the time  FSBG frequency: none  Hyperglycemia: no  Hypoglycemia: no  Hypoglycemia unawareness?: no  Meals per day / Carb intake: 2-3 very small meals/ day  Exercise: no    Complications of DM:  CAD: No  CVA: No  PVD: No  Amputations: No  Retinopathy: No  Gastropathy: No  Nephropathy: No  Neuropathy: No  Depression: No  Chronic wounds / delayed healing: No  DM related hospitalizations: No    Last dilated eye exam:   Last dental exam:   Last DM education / nutritionist visit:     DM related medications:  Statin: Yes, atorvastatin 20 mg  ACE-I: No  ASA: Yes         Review of Systems  14 point review of systems was preformed and was negative    Objective:          ALPRAZolam (XANAX) 0.5 mg tablet Take one tablet by mouth twice daily as needed.    aspirin EC 81 mg tablet Take one tablet by mouth daily.    buPROPion XL (WELLBUTRIN XL) 300 mg tablet Take one tablet by mouth every morning. Do not crush or chew.    celecoxib (CELEBREX) 200 mg capsule Take one capsule by mouth daily.    doxycycline (MONODOX) 100 mg capsule Take one capsule by mouth daily.    ergocalciferol (vitamin D2) (VITAMIN D PO) Take 2,000 Units by mouth daily.    eszopiclone (LUNESTA) 3 mg tablet Take one tablet by mouth nightly as needed.    ezetimibe (ZETIA) 10 mg tablet Take one tablet by mouth daily.    finasteride (PROPECIA) 1 mg tablet Take one tablet by mouth daily.    fludrocortisone (FLORINEF) 0.1 mg tablet Take 1 tablet by mouth daily.    folic acid (FOLVITE) 1 mg tablet Take one tablet by mouth daily.    gabapentin (NEURONTIN) 600 mg tablet TAKE 1 TABLET BY MOUTH TWICE DAILY    hydrOXYchloroQUINE (PLAQUENIL) 200 mg tablet Take two tablets by mouth daily.    methotrexate sodium (RHEUMATREX) 2.5 mg tablet TAKE 8 TABLETS BY MOUTH EVERY WEEK    metoprolol succinate XL (TOPROL XL) 50 mg extended release tablet Take 1 tablet by mouth daily at bedtime.    omeprazole DR (PRILOSEC) 40 mg capsule TAKE 1 CAPSULE BY MOUTH DAILY    promethazine-codeine (PHENERGAN W/CODEINE) 6.25-10 mg/5 mL oral syrup Take 5 mL by mouth every 6 hours as  needed for Cough.    rosuvastatin (CRESTOR) 20 mg tablet Take 1 tablet by mouth once daily.    silodosin (RAPAFLO) 8 mg capsule Take one capsule by mouth daily.    tirzepatide (MOUNJARO) 5 mg/0.5 mL injector EN Inject 0.5 mL under the skin every 7 days.    traMADoL (ULTRAM) 50 mg tablet Take one tablet by mouth at bedtime daily.     Vitals:    10/17/23 1401   BP: 117/90   BP Source: Arm, Left Upper   Pulse: 59   Resp: 20   PainSc: Zero   Weight: 79.4 kg (175 lb)   Height: 175.3 cm (5' 9)     Body mass index is 25.84 kg/m?Aaron Aas      Telehealth Patient Reported Vitals       Row Name 10/17/23 1401                Pain Score Zero        Patient Position Sitting        Resp 20        BP Source Arm, Left Upper                        Physical Exam  Constitutional:       Appearance: He is underweight.   Pulmonary:      Effort: Pulmonary effort is normal.   Musculoskeletal:      Cervical back: Normal range of motion.   Neurological:      Mental Status: He is alert and oriented to person, place, and time.   Psychiatric:         Mood and Affect: Mood normal.         Behavior: Behavior normal.         Thought Content: Thought content normal.              Assessment and Plan:  Diabetes mellitus type 2,  controlled   A1c today in clinic 4.9%  Target A1c < 6.5% without frequent or severe hypoglycemia and > 70% time in range  Currently on Mounjaro 5 mg  Complicated by: None    Plan:  Pt with excellent glucose control  Continue Mounjaro 5 mg each week  Check BG PRN        Hyperparathyroid:  Currently eating diet rich in calcium and taking vitamin d daily  Last PTH: 129 in September 2023  Continue current regimen      Diabetic complication assessment:   Annual Dilated eye exam:   Annual labs (electrolytes and renal function): Reviewed, last done September 2023  Annual Urine microalbumin/Cr: Completed in November 2024  Foot exam / monofilament exam (recommended annually):     Supportive care for diabetes:  Diabetic Educator (recommended annually):   Nutritionist:       Lipids: Hyperlipidemia    Recommendations for Statin treatment in diabetes:  103-75 yo: No risk factors - moderate dose statin.  CVD risk factors or overt CVD - high dose statin.    CVD risk factors include none  Currently on a statin: Atorvastatin 20 mg  Annual fasting lipid panel: Completed December 2022    Blood pressure: Hypertension   ACE-I or ARB: No      RTC 6 months with MD for hyperparathyroid          Orders Placed This Encounter    GLUCOSE METER DOWNLOAD    POC HEMOGLOBIN A1C    POC GLUCOSE QUANTITATIVE  BLOOD     Patient Instructions   It was nice to see you today!    Goals we discussed today:     Keep up the great work!!    Continue Mounjaro 5 mg each week      If you fill a prescription and it's your last refill please let us  know in a MyChart message so we can refill this before you run out    If you have any immediate concerns after 4pm, weekends, or holidays please contact the endocrinologist on call: [phone], and ask for the endocrinologist on call to be paged    Please contact [facility] for any questions or abnormal glucose values (above 300 or less than 70 repeatedly).  [phone]   My nurse, Alexa, can be reached at [phone]    Future Appointments   Date Time Provider Department Center   04/24/2024  8:30 AM Maile Score, DO [facility]                              30 minutes spent on this patient's encounter with counseling and coordination of care taking >50% of the visit.

## 2023-10-28 ENCOUNTER — Encounter: Admit: 2023-10-28 | Discharge: 2023-10-28 | Payer: PRIVATE HEALTH INSURANCE

## 2023-10-28 NOTE — Progress Notes
 LVM with radiology scheduling number [phone] to schedule imaging exam    -CT Chest

## 2023-10-31 ENCOUNTER — Encounter: Admit: 2023-10-31 | Discharge: 2023-10-31 | Payer: PRIVATE HEALTH INSURANCE

## 2023-10-31 MED ORDER — FLUDROCORTISONE 0.1 MG PO TAB
0.1 mg | ORAL_TABLET | Freq: Every day | ORAL | 3 refills | 90.00000 days | Status: AC
Start: 2023-10-31 — End: ?

## 2023-11-25 ENCOUNTER — Encounter: Admit: 2023-11-25 | Discharge: 2023-11-25 | Payer: PRIVATE HEALTH INSURANCE

## 2023-12-01 ENCOUNTER — Encounter: Admit: 2023-12-01 | Discharge: 2023-12-01 | Payer: PRIVATE HEALTH INSURANCE

## 2023-12-04 ENCOUNTER — Encounter: Admit: 2023-12-04 | Discharge: 2023-12-04 | Payer: PRIVATE HEALTH INSURANCE

## 2023-12-06 ENCOUNTER — Encounter: Admit: 2023-12-06 | Discharge: 2023-12-06 | Payer: PRIVATE HEALTH INSURANCE

## 2024-03-16 ENCOUNTER — Encounter: Admit: 2024-03-16 | Discharge: 2024-03-16 | Payer: PRIVATE HEALTH INSURANCE

## 2024-04-09 ENCOUNTER — Encounter: Admit: 2024-04-09 | Discharge: 2024-04-09 | Payer: PRIVATE HEALTH INSURANCE

## 2024-04-17 ENCOUNTER — Encounter: Admit: 2024-04-17 | Discharge: 2024-04-17 | Payer: PRIVATE HEALTH INSURANCE

## 2024-04-24 ENCOUNTER — Encounter: Admit: 2024-04-24 | Discharge: 2024-04-24 | Payer: PRIVATE HEALTH INSURANCE

## 2024-04-24 ENCOUNTER — Ambulatory Visit: Admit: 2024-04-24 | Discharge: 2024-04-25 | Payer: PRIVATE HEALTH INSURANCE

## 2024-04-24 VITALS — BP 119/80 | HR 60 | Ht 69.016 in | Wt 176.6 lb

## 2024-04-24 MED ORDER — MOUNJARO 5 MG/0.5 ML SC PNIJ
5 mg | SUBCUTANEOUS | 11 refills | 28.00000 days | Status: AC
Start: 2024-04-24 — End: ?

## 2024-04-24 NOTE — Patient Instructions [37]
 It was a pleasure seeing you today.      If you are having labs and/or imaging done today, please proceed to the lab on the 1st floor of the Medical Office Building or radiology on the 2nd floor.  I will be in contact once those have resulted.      For any questions regarding what we have discussed at today's visit, please send a My Chart message to my nurse, April Hopkins, or call her extension at [phone].  All messages will get routed to me.      Additional phone numbers which might be helpful:  Clinic number: [phone]--for scheduling   Infusion center: [phone]  Radiology scheduling number: [phone]

## 2024-04-24 NOTE — Progress Notes [1]
 Prepped for foot exam.  Patient didn't bring meter today.    BG 88, A1c 4.9%

## 2024-04-24 NOTE — Progress Notes [1]
 Date of Service: 04/24/2024    Subjective:             Philip Gilbert is a 63 y.o. male.    History of Present Illness    Reason for visit:  follow up, hyperparathyroidism  Last office visit: 03/2023 with me and 10/2023 with Alan Pouch APRN    Type 2 Diabetes mellitus  Dx: 2000  A1c 4.9% today, prior A1c 4.9% in May 2025     Interval history:  He is doing well today, no concerns.   No home blood sugar data available for review.   Reports no issues with Mounjaro  5mg , no side effects like he was having on higher doses, and weight has stabilized.  He has no concerns with his feet nor vision today.     Following with rheumatology for gout and seronegative RA.  On allopurinal, sulfasalazine.   Prior elevated liver function testing has improved.     Works through [employer].   He uses [employer] for his medications.     Current DM regimen:   Mounjaro  5mg  weekly    Past treatment: Lantus, Metformin , Ozempic  (switched to Mounjaro )    Complications of DM:  CAD: No  CVA: No  PVD: No  Amputations: No  Retinopathy: 1 year ago, needs updated  Gastropathy: No  Nephropathy: No  Neuropathy: No     DM related medications:  Statin: Yes, rosuvastatin 20 mg  ACE-I: No  ASA: Yes    Secondary hyperparathyroidism  History of lap band surgery in 2012  History of Roux-en-Y gastric bypass in 2003  Hypocalciuria   03/2020 24 hour urine calcium 74 (Care Everywhere)    He is taking Vitamin D 5,000 units daily.   He is not taking calcium supplement. Does take an OTC mutlivitamin  He is nervous about having a kidney stones.   He cannot eat ice cream. He does try to increase dairy. He is able to tolerate other dairy products.   He has never had a kidney stones. His nephew had kidney stone at age 30.     No interval fall nor fracture.  Prednisone as needed for RA for flares, a few days every 3-4 months.      Osteopenia (risk factors: vitamin D deficiency, bariatric surgery)   -2021 negative celiac serology   - has + history of RA, on methotrexate    DXA - 01/15/2022 ([facility])  Lumbar spine: BMD 1.290 g/cm2, T-score 0.4  Mean femoral neck:  BMD 0.989 g/cm2, T-score -0.8     Latest Reference Range & Units 02/20/22 00:00   Albumin 3.4 - 4.8 g/dL 4   Calcium 8.8 - 10 mg/dL 8.5 !   PTH Hormone 16 - 77 pg/mL 129 !   Vitamin D(25-OH)Total 30 - 100 ng/mL 31     Wt Readings from Last 8 Encounters:   04/24/24 80.1 kg (176 lb 9.6 oz)   10/17/23 79.4 kg (175 lb)   09/13/23 82.1 kg (181 lb)   05/14/23 70.3 kg (154 lb 14.4 oz)   01/16/23 78 kg (172 lb)   07/17/22 78.9 kg (174 lb)   10/10/21 81.5 kg (179 lb 11.2 oz)   09/06/21 79.1 kg (174 lb 4.8 oz)            ROS  Denies unintentional weight loss/weight gain, fatigue, fever, chills, N/V, abdominal pain, heat/cold intolerance, constipation/diarrhea, polyuria, polydipsia, rash, headache or vision change      Objective:  ALPRAZolam (XANAX) 0.5 mg tablet Take one tablet by mouth twice daily as needed.    aspirin EC 81 mg tablet Take one tablet by mouth daily.    buPROPion  XL (WELLBUTRIN  XL) 300 mg tablet Take one tablet by mouth every morning. Do not crush or chew.    celecoxib (CELEBREX) 200 mg capsule Take one capsule by mouth daily.    doxycycline  (MONODOX ) 100 mg capsule Take one capsule by mouth daily.    ergocalciferol (vitamin D2) (VITAMIN D PO) Take 2,000 Units by mouth daily.    eszopiclone (LUNESTA) 3 mg tablet Take one tablet by mouth nightly as needed.    ezetimibe (ZETIA) 10 mg tablet Take one tablet by mouth daily.    finasteride (PROPECIA) 1 mg tablet Take one tablet by mouth daily.    fludrocortisone  (FLORINEF ) 0.1 mg tablet Take 1 tablet by mouth daily.    folic acid (FOLVITE) 1 mg tablet Take one tablet by mouth daily.    gabapentin  (NEURONTIN ) 600 mg tablet TAKE 1 TABLET BY MOUTH TWICE DAILY    hydrOXYchloroQUINE (PLAQUENIL) 200 mg tablet Take two tablets by mouth daily.    methotrexate sodium (RHEUMATREX) 2.5 mg tablet TAKE 8 TABLETS BY MOUTH EVERY WEEK    metoprolol  succinate XL (TOPROL  XL) 50 mg extended release tablet Take 1 tablet by mouth daily at bedtime.    omeprazole  DR (PRILOSEC) 40 mg capsule TAKE 1 CAPSULE BY MOUTH DAILY    promethazine -codeine  (PHENERGAN  W/CODEINE ) 6.25-10 mg/5 mL oral syrup Take 5 mL by mouth every 6 hours as needed for Cough.    rosuvastatin (CRESTOR) 20 mg tablet Take 1 tablet by mouth once daily.    silodosin  (RAPAFLO ) 8 mg capsule Take one capsule by mouth daily.    tirzepatide  (MOUNJARO ) 5 mg/0.5 mL injector EN Inject 0.5 mL under the skin every 7 days.    traMADoL (ULTRAM) 50 mg tablet Take one tablet by mouth at bedtime daily.     There were no vitals filed for this visit.  There is no height or weight on file to calculate BMI.     Physical Exam  General: alert, oriented to person, place, time event, no apparent distress  Head: normocephalic, atraumatic  Neck: no gross thyromegaly  Eyes: EOMI grossly intact  Pulm: no respiratory distress on room air  Psych: mood and affect congruent    Diabetic Foot Exam       Bilateral vascular, sensation, integument are normal:  Yes      Comprehensive Metabolic Profile    Lab Results   Component Value Date/Time    NA 140 08/22/2023 12:00 AM    K 4.6 08/22/2023 12:00 AM    CL 105 08/22/2023 12:00 AM    CO2 27.0 08/22/2023 12:00 AM    GAP 8 08/22/2023 12:00 AM    BUN 10.9 08/22/2023 12:00 AM    CR 0.97 08/22/2023 12:00 AM    GLU 94 08/22/2023 12:00 AM    Lab Results   Component Value Date/Time    CA 8.0 08/22/2023 12:00 AM    PO4 3.3 01/06/2021 11:06 AM    ALBUMIN 3.3 08/22/2023 12:00 AM    TOTPROT 5.6 08/22/2023 12:00 AM    ALKPHOS 69 08/22/2023 12:00 AM    AST 72 08/22/2023 12:00 AM    ALT 73 08/22/2023 12:00 AM    TOTBILI 1.01 08/22/2023 12:00 AM    GFR 87.2 08/22/2023 12:00 AM    GFRAA >60 11/05/2019 01:18 PM  Hemoglobin A1C   Date Value Ref Range Status   05/14/2023 5.3 4.0 - 5.7 % Final     Comment:     The ADA recommends that most patients with type 1 and type 2 diabetes maintain an A1c level <7%.   08/23/2022 4.7 <5.7 % Final   02/20/2022 5.4 <5.7 % Final     Poc Hemoglobin A1C   Date Value Ref Range Status   10/17/2023 4.9 4 - 6 % Final     TSH   Date Value Ref Range Status   01/18/2023 1.36 0.35 - 4.94 MIU/mL Final   08/31/2021 1.350  Final     TSH Thyroid Screen   Date Value Ref Range Status   02/20/2022 1.23 0.35 - 4.94 mIU/mL Final       No results found for: MCALB24   Microalbumin/CR ratio Urine   Date Value Ref Range Status   05/08/2023 10.8 <30.0 Final     Microalbumin, Random   Date Value Ref Range Status   05/08/2023 12.0 mg/dL Final     LDL   Date Value Ref Range Status   05/08/2023 37 <100 mg/dL Final   91/97/7975 42 <899 mg/dL Final     HDL   Date Value Ref Range Status   05/08/2023 58 >=40 mg/dL Final   91/97/7975 55 >59 mg/dL Final     Triglycerides   Date Value Ref Range Status   05/08/2023 59 <150 mg/dL Final   91/97/7975 45 <849 mg/dL Final     Cholesterol   Date Value Ref Range Status   05/08/2023 106 <200 mg/dL Final   91/97/7975 893 <200 mg/dL Final            Assessment and Plan:    Type 2 DM  A1c 4.9% today  No ambulatory glucose data for review.    Diabetes control is excellent. Tolerating Mounjaro  5mg  weekly, had side effects to higher doses.  Weight at goal, stable. Will not escalate Mounjaro  at this time.     Plan:  - continue Mounjaro  5mg  weekly  - POC home glucose checks  - annual eye exam    Secondary hyperparathyroidism  History of lap band surgery in 2012  History of Roux-en-Y gastric bypass in 2003  History of osteopenia  Hypocalciuria   03/2020 24 hour urine calcium 74 (Care Everywhere)    Secondary hyperparathyroidism due to hypocalcemia and history of Vitamin D deficiency in the setting of prior bariatric surgery    - update calcium and PTH labs today - sent him with printed lab orders  - discussed importance of 1,200mg  daily calcium through diet or supplement  - remains on Vitamin D 5,000 units daily    He would like labs done at [facility] in [place], [place]. RTC 6 months with Alan Trudy Rockey Billy D.O.   Endocrinology, Metabolism, Clinical Pharmacology   Pager: [phone]  Available on Voalte

## 2024-04-25 DIAGNOSIS — E139 Other specified diabetes mellitus without complications: Secondary | ICD-10-CM

## 2024-04-25 DIAGNOSIS — E213 Hyperparathyroidism, unspecified: Principal | ICD-10-CM

## 2024-05-13 ENCOUNTER — Encounter: Admit: 2024-05-13 | Discharge: 2024-05-13 | Payer: PRIVATE HEALTH INSURANCE

## 2024-05-13 MED ORDER — METOPROLOL SUCCINATE 50 MG PO TB24
50 mg | ORAL_TABLET | Freq: Every evening | ORAL | 3 refills | 90.00000 days | Status: AC
Start: 2024-05-13 — End: ?

## 2024-06-20 ENCOUNTER — Encounter: Admit: 2024-06-20 | Discharge: 2024-06-20 | Payer: PRIVATE HEALTH INSURANCE

## 2024-07-01 ENCOUNTER — Encounter: Admit: 2024-07-01 | Discharge: 2024-07-01 | Payer: PRIVATE HEALTH INSURANCE

## 2024-07-07 ENCOUNTER — Encounter: Admit: 2024-07-07 | Discharge: 2024-07-07 | Payer: PRIVATE HEALTH INSURANCE

## 2024-07-08 ENCOUNTER — Encounter: Admit: 2024-07-08 | Discharge: 2024-07-08 | Payer: PRIVATE HEALTH INSURANCE

## 2024-07-14 ENCOUNTER — Encounter: Admit: 2024-07-14 | Discharge: 2024-07-14 | Payer: PRIVATE HEALTH INSURANCE

## 2024-07-17 ENCOUNTER — Encounter: Admit: 2024-07-17 | Discharge: 2024-07-17 | Payer: PRIVATE HEALTH INSURANCE

## 2024-07-21 ENCOUNTER — Encounter: Admit: 2024-07-21 | Discharge: 2024-07-21 | Payer: PRIVATE HEALTH INSURANCE
